# Patient Record
Sex: Male | Born: 1965 | Race: White | Hispanic: No | Marital: Married | Smoking: Current every day smoker
Health system: Southern US, Community
[De-identification: ages and names within clinical notes are randomized; demographics above are authoritative.]

## PROBLEM LIST (undated history)

## (undated) DIAGNOSIS — E079 Disorder of thyroid, unspecified: Secondary | ICD-10-CM

## (undated) DIAGNOSIS — E785 Hyperlipidemia, unspecified: Secondary | ICD-10-CM

## (undated) DIAGNOSIS — I639 Cerebral infarction, unspecified: Secondary | ICD-10-CM

## (undated) DIAGNOSIS — I10 Essential (primary) hypertension: Secondary | ICD-10-CM

## (undated) DIAGNOSIS — G8929 Other chronic pain: Secondary | ICD-10-CM

## (undated) DIAGNOSIS — E039 Hypothyroidism, unspecified: Secondary | ICD-10-CM

## (undated) DIAGNOSIS — F32A Depression, unspecified: Secondary | ICD-10-CM

## (undated) DIAGNOSIS — I4891 Unspecified atrial fibrillation: Secondary | ICD-10-CM

## (undated) HISTORY — PX: SHOULDER SURGERY: SHX246

---

## 2001-02-15 ENCOUNTER — Ambulatory Visit (HOSPITAL_BASED_OUTPATIENT_CLINIC_OR_DEPARTMENT_OTHER): Admission: RE | Admit: 2001-02-15 | Discharge: 2001-02-15 | Payer: Self-pay | Admitting: Orthopedic Surgery

## 2005-03-07 ENCOUNTER — Emergency Department: Payer: Self-pay | Admitting: Emergency Medicine
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
## 2005-09-27 ENCOUNTER — Emergency Department: Payer: Self-pay | Admitting: Emergency Medicine

## 2012-06-08 ENCOUNTER — Emergency Department: Payer: Self-pay | Admitting: *Deleted

## 2012-06-08 LAB — CK TOTAL AND CKMB (NOT AT ARMC)
CK, Total: 56 U/L (ref 35–232)
CK-MB: 0.8 ng/mL (ref 0.5–3.6)

## 2012-06-08 LAB — BASIC METABOLIC PANEL
BUN: 19 mg/dL — ABNORMAL HIGH (ref 7–18)
Chloride: 109 mmol/L — ABNORMAL HIGH (ref 98–107)
Creatinine: 0.78 mg/dL (ref 0.60–1.30)
EGFR (African American): >60
EGFR (Non-African Amer.): >60
Osmolality: 284 (ref 275–301)
Sodium: 141 mmol/L (ref 136–145)

## 2012-06-08 LAB — CBC
HCT: 49.1 % (ref 40.0–52.0)
MCH: 32.3 pg (ref 26.0–34.0)
MCHC: 34.2 g/dL (ref 32.0–36.0)
MCV: 95 fL (ref 80–100)
Platelet: 244 10*3/uL (ref 150–440)
RDW: 13 % (ref 11.5–14.5)

## 2012-06-08 LAB — TROPONIN I
Troponin-I: <0.02 ng/mL
Troponin-I: <0.02 ng/mL

## 2013-12-24 ENCOUNTER — Ambulatory Visit: Payer: Self-pay | Admitting: Physician Assistant

## 2013-12-24 LAB — RAPID STREP-A WITH REFLX: Micro Text Report: NEGATIVE

## 2013-12-27 LAB — BETA STREP CULTURE(ARMC)

## 2019-06-03 ENCOUNTER — Encounter: Payer: Self-pay | Admitting: Emergency Medicine

## 2019-06-03 ENCOUNTER — Other Ambulatory Visit: Payer: Self-pay

## 2019-06-03 ENCOUNTER — Emergency Department: Payer: BC Managed Care – PPO

## 2019-06-03 ENCOUNTER — Emergency Department
Admission: EM | Admit: 2019-06-03 | Discharge: 2019-06-03 | Disposition: A | Discharge status: Home / Self Care | Payer: BC Managed Care – PPO | Attending: Emergency Medicine | Admitting: Emergency Medicine

## 2019-06-03 DIAGNOSIS — Z79899 Other long term (current) drug therapy: Secondary | ICD-10-CM | POA: Insufficient documentation

## 2019-06-03 DIAGNOSIS — F17210 Nicotine dependence, cigarettes, uncomplicated: Secondary | ICD-10-CM | POA: Insufficient documentation

## 2019-06-03 DIAGNOSIS — M5417 Radiculopathy, lumbosacral region: Secondary | ICD-10-CM | POA: Diagnosis not present

## 2019-06-03 DIAGNOSIS — I10 Essential (primary) hypertension: Secondary | ICD-10-CM | POA: Insufficient documentation

## 2019-06-03 DIAGNOSIS — R1030 Lower abdominal pain, unspecified: Secondary | ICD-10-CM

## 2019-06-03 HISTORY — DX: Essential (primary) hypertension: I10

## 2019-06-03 HISTORY — DX: Disorder of thyroid, unspecified: E07.9

## 2019-06-03 HISTORY — DX: Other chronic pain: G89.29

## 2019-06-03 LAB — URINALYSIS, ROUTINE W REFLEX MICROSCOPIC
Bacteria, UA: NONE SEEN
Bilirubin Urine: NEGATIVE
Glucose, UA: NEGATIVE mg/dL
Ketones, ur: NEGATIVE mg/dL
Leukocytes,Ua: NEGATIVE
Nitrite: NEGATIVE
Protein, ur: NEGATIVE mg/dL
Specific Gravity, Urine: >1.046 — ABNORMAL HIGH (ref 1.005–1.030)
Squamous Epithelial / LPF: NONE SEEN (ref 0–5)
pH: 6 (ref 5.0–8.0)

## 2019-06-03 LAB — BASIC METABOLIC PANEL
Anion gap: 11 (ref 5–15)
BUN: 10 mg/dL (ref 6–20)
CO2: 24 mmol/L (ref 22–32)
Calcium: 9.2 mg/dL (ref 8.9–10.3)
Chloride: 105 mmol/L (ref 98–111)
Creatinine, Ser: 0.68 mg/dL (ref 0.61–1.24)
GFR calc Af Amer: >60 mL/min (ref >60)
GFR calc non Af Amer: >60 mL/min (ref >60)
Glucose, Bld: 103 mg/dL — ABNORMAL HIGH (ref 70–99)
Potassium: 3.8 mmol/L (ref 3.5–5.1)
Sodium: 140 mmol/L (ref 135–145)

## 2019-06-03 LAB — CBC
HCT: 42.1 % (ref 39.0–52.0)
Hemoglobin: 14.3 g/dL (ref 13.0–17.0)
MCH: 31.6 pg (ref 26.0–34.0)
MCHC: 34 g/dL (ref 30.0–36.0)
MCV: 92.9 fL (ref 80.0–100.0)
Platelets: 236 10*3/uL (ref 150–400)
RBC: 4.53 MIL/uL (ref 4.22–5.81)
RDW: 13 % (ref 11.5–15.5)
WBC: 11 10*3/uL — ABNORMAL HIGH (ref 4.0–10.5)
nRBC: 0 % (ref 0.0–0.2)

## 2019-06-03 MED ORDER — DEXAMETHASONE SODIUM PHOSPHATE 10 MG/ML IJ SOLN
10.0000 mg | Freq: Once | INTRAMUSCULAR | Status: AC
  Administered 2019-06-03: 19:00:00 10 mg via INTRAVENOUS
  Filled 2019-06-03: qty 1

## 2019-06-03 MED ORDER — OXYCODONE-ACETAMINOPHEN 5-325 MG PO TABS
2.0000 | ORAL_TABLET | Freq: Once | ORAL | Status: AC
  Administered 2019-06-03: 22:00:00 2 via ORAL
  Filled 2019-06-03: qty 2

## 2019-06-03 MED ORDER — HYDROMORPHONE HCL 1 MG/ML IJ SOLN
1.0000 mg | Freq: Once | INTRAMUSCULAR | Status: AC
  Administered 2019-06-03: 1 mg via INTRAVENOUS
  Filled 2019-06-03: qty 1

## 2019-06-03 MED ORDER — KETOROLAC TROMETHAMINE 30 MG/ML IJ SOLN
15.0000 mg | Freq: Once | INTRAMUSCULAR | Status: AC
  Administered 2019-06-03: 17:00:00 15 mg via INTRAVENOUS
  Filled 2019-06-03: qty 1

## 2019-06-03 MED ORDER — IOHEXOL 300 MG/ML  SOLN
100.0000 mL | Freq: Once | INTRAMUSCULAR | Status: AC | PRN
  Administered 2019-06-03: 100 mL via INTRAVENOUS

## 2019-06-03 MED ORDER — SODIUM CHLORIDE 0.9 % IV BOLUS
1000.0000 mL | Freq: Once | INTRAVENOUS | Status: AC
  Administered 2019-06-03: 19:00:00 1000 mL via INTRAVENOUS

## 2019-06-03 MED ORDER — OXYCODONE HCL 5 MG PO TABS: 5.0000 mg | ORAL_TABLET | Freq: Four times a day (QID) | ORAL | 0 refills | Status: AC | PRN

## 2019-06-03 MED ORDER — PREDNISONE 20 MG PO TABS: 40.0000 mg | ORAL_TABLET | Freq: Every day | ORAL | 0 refills | Status: AC

## 2019-06-03 NOTE — ED Notes (Signed)
Peripheral IV discontinued. Catheter intact. No signs of infiltration or redness. Gauze applied to IV site.    Discharge instructions reviewed with patient. Questions fielded by this RN. Patient verbalizes understanding of instructions. Patient discharged home in stable condition per issacs. No acute distress noted at time of discharge.    

## 2019-06-03 NOTE — ED Provider Notes (Signed)
[facility] Emergency Department Provider Note  ____________________________________________   First MD Initiated Contact with Patient 06/03/19 1639     (approximate)  I have reviewed the triage vital signs and the nursing notes.   HISTORY  Chief Complaint Groin Pain    HPI Marc Miller is a 53 y.o. male  With h/o chronic pain, HTN, here with severe groin pain. Pt states that no Thursday PM, he was getting out of his truck when he experienced acute onset of severe groin pain. He's had associated mild sacral pain radiating aroudn to the groin. He is adamant he did not fall or injure himself, but does have chronic back issues. Pain shoots throughout his groin but denies any loss of bowel or bladder continence. Denies any LE weakness or numbness. He has had difficulty ambulating 2/2 his pain. No fevers, chills. No testicular pain or swelling. No hematuria, dysuria. No diarrhea or constipation. Pain is worse w/ standing, no alleviating factors noted.     Past Medical History:  Diagnosis Date   Chronic pain    Hypertension    Thyroid disease     There are no active problems to display for this patient.   Past Surgical History:  Procedure Laterality Date   SHOULDER SURGERY Left     Prior to Admission medications   Medication Sig Start Date End Date Taking? Authorizing Provider  oxyCODONE (ROXICODONE) 5 MG immediate release tablet Take 1-2 tablets (5-10 mg total) by mouth every 6 (six) hours as needed for moderate pain or severe pain. 06/03/19 06/02/20  Shaune PollackIsaacs, Ita Fritzsche, MD  predniSONE (DELTASONE) 20 MG tablet Take 2 tablets (40 mg total) by mouth daily for 5 days. 06/03/19 06/08/19  Shaune PollackIsaacs, Kayhan Boardley, MD    Allergies Bupropion, Aspirin, and Methimazole  History reviewed. No pertinent family history.  Social History Social History   Tobacco Use   Smoking status: Current Every Day Smoker    Packs/day: 0.25    Types: Cigarettes    Smokeless tobacco: Never Used  Substance Use Topics   Alcohol use: Not on file   Drug use: Not on file    Review of Systems  Review of Systems  Constitutional: Negative for chills, fatigue and fever.  HENT: Negative for sore throat.   Respiratory: Negative for shortness of breath.   Cardiovascular: Negative for chest pain.  Gastrointestinal: Positive for abdominal pain.  Genitourinary: Negative for flank pain.  Musculoskeletal: Positive for back pain and gait problem. Negative for neck pain.  Skin: Negative for rash and wound.  Allergic/Immunologic: Negative for immunocompromised state.  Neurological: Negative for weakness and numbness.  Hematological: Does not bruise/bleed easily.  All other systems reviewed and are negative.    ____________________________________________  PHYSICAL EXAM:      VITAL SIGNS: ED Triage Vitals  Enc Vitals Group     BP 06/03/19 1348 110/78     Pulse Rate 06/03/19 1348 74     Resp 06/03/19 1348 18     Temp 06/03/19 1348 99.7 F (37.6 C)     Temp Source 06/03/19 1348 Oral     SpO2 06/03/19 1348 97 %     Weight 06/03/19 1352 200 lb (90.7 kg)     Height 06/03/19 1352 5' 8.5" (1.74 m)     Head Circumference --      Peak Flow --      Pain Score 06/03/19 1351 8     Pain Loc --      Pain  Edu? --      Excl. in GC? --      Physical Exam Vitals signs and nursing note reviewed.  Constitutional:      General: He is not in acute distress.    Appearance: He is well-developed.  HENT:     Head: Normocephalic and atraumatic.  Eyes:     Conjunctiva/sclera: Conjunctivae normal.  Neck:     Musculoskeletal: Neck supple.  Cardiovascular:     Rate and Rhythm: Normal rate and regular rhythm.     Heart sounds: Normal heart sounds. No murmur. No friction rub.  Pulmonary:     Effort: Pulmonary effort is normal. No respiratory distress.     Breath sounds: Normal breath sounds. No wheezing or rales.  Abdominal:     General: There is no distension.      Palpations: Abdomen is soft.     Tenderness: There is no abdominal tenderness.  Genitourinary:    Comments: Testes descended bilaterally. Normal lie. No penile lesions. Normal perineal sensation. Skin:    General: Skin is warm.     Capillary Refill: Capillary refill takes less than 2 seconds.  Neurological:     Mental Status: He is alert and oriented to person, place, and time.     Motor: No abnormal muscle tone.      Spine Exam: Inspection/Palpation: Moderate lower lumbar TTP. No deformity. No stepoffs. Strength: 5/5 throughout LE bilaterally (hip flexion/extension, adduction/abduction; knee flexion/extension; foot dorsiflexion/plantarflexion, inversion/eversion; great toe inversion) Sensation: Intact to light touch in proximal and distal LE bilaterally Reflexes: 2+ quadriceps and achilles reflexes ____________________________________________   LABS (all labs ordered are listed, but only abnormal results are displayed)  Labs Reviewed  CBC - Abnormal; Notable for the following components:      Result Value   WBC 11.0 (*)    All other components within normal limits  BASIC METABOLIC PANEL - Abnormal; Notable for the following components:   Glucose, Bld 103 (*)    All other components within normal limits  URINALYSIS, ROUTINE W REFLEX MICROSCOPIC - Abnormal; Notable for the following components:   Color, Urine YELLOW (*)    APPearance CLEAR (*)    Specific Gravity, Urine >1.046 (*)    Hgb urine dipstick MODERATE (*)    All other components within normal limits    ____________________________________________  EKG: None ________________________________________  RADIOLOGY All imaging, including plain films, CT scans, and ultrasounds, independently reviewed by me, and interpretations confirmed via formal radiology reads.  ED MD interpretation:   CT: L5-S1 advanced disc disease, no other abdominal or pelvic findings, no mass lesions or adenopathy  Official radiology  report(s): Ct Abdomen Pelvis W Contrast  Result Date: 06/03/2019 CLINICAL DATA:  Severe lower abdominal pain since Thursday. EXAM: CT ABDOMEN AND PELVIS WITH CONTRAST TECHNIQUE: Multidetector CT imaging of the abdomen and pelvis was performed using the standard protocol following bolus administration of intravenous contrast. CONTRAST:  100mL OMNIPAQUE IOHEXOL 300 MG/ML  SOLN COMPARISON:  None. FINDINGS: Lower chest: The lung bases are clear of acute process. No pleural effusion or pulmonary lesions. The heart is normal in size. No pericardial effusion. The distal esophagus and aorta are unremarkable. Hepatobiliary: No focal hepatic lesions or intrahepatic biliary dilatation. The gallbladder is normal. No common bile duct dilatation. Pancreas: No mass, inflammation or ductal dilatation. Spleen: Normal size.  No focal lesions. Adrenals/Urinary Tract: The adrenal glands and kidneys are unremarkable. No renal, ureteral or bladder calculi or mass is identified. Cortical scarring changes at the  midpole lower pole junction region of the right kidney may be related to prior infection or infarction. Normal perfusion of both kidneys. The delayed images do not demonstrate any significant collecting system abnormalities. Stomach/Bowel: The stomach, duodenum, small bowel and colon are unremarkable. No acute inflammatory changes, mass lesions or obstructive findings. The terminal ileum and appendix are normal. Vascular/Lymphatic: The aorta is normal in caliber. No dissection. The branch vessels are patent. The major venous structures are patent. No mesenteric or retroperitoneal mass or adenopathy. Small scattered lymph nodes are noted. Reproductive: The prostate gland and seminal vesicles are unremarkable. Other: No pelvic mass or adenopathy. No free pelvic fluid collections. No inguinal mass or adenopathy. No abdominal wall hernia or subcutaneous lesions. Musculoskeletal: No significant bony findings. Moderate degenerative  changes are noted in the lumbar spine with advanced disc disease at L5-S1. No large disc protrusions or obvious spinal stenosis. IMPRESSION: 1. No acute abdominal/pelvic findings, mass lesions or adenopathy. 2. Moderate degenerative changes involving the spine with advanced disc disease at L5-S1 but no obvious large lumbar disc protrusions, spinal or foraminal stenosis. Electronically Signed   By: Marijo Sanes M.D.   On: 06/03/2019 17:40    ____________________________________________  PROCEDURES   Procedure(s) performed (including Critical Care):  Procedures  ____________________________________________  INITIAL IMPRESSION / MDM / [place] / ED COURSE  As part of my medical decision making, I reviewed the following data within the electronic MEDICAL RECORD NUMBER Notes from prior ED visits and [place] Controlled Substance Database      *Lazlo Tunney Miller was evaluated in Emergency Department on 06/03/2019 for the symptoms described in the history of present illness. He was evaluated in the context of the global COVID-19 pandemic, which necessitated consideration that the patient might be at risk for infection with the SARS-CoV-2 virus that causes COVID-19. Institutional protocols and algorithms that pertain to the evaluation of patients at risk for COVID-19 are in a state of rapid change based on information released by regulatory bodies including the CDC and federal and state organizations. These policies and algorithms were followed during the patient's care in the ED.  Some ED evaluations and interventions may be delayed as a result of limited staffing during the pandemic.*      Medical Decision Making: 53 yo M here with severe medial and posterior leg and groin pain after getting out of his truck. No fevers. No dysuria or urinary sx. On exam, he is awake, alert, and in NAD. Distal NVI in b/l LE. UA c/w dehydration, mild hematuria noted but no stone noted on CT. CT scan shows  L5-S1 radiculopathy which could explain his pain and makes sense w/ his positional nature. DDx also includes renal stone, likely passed, 2/2 hematuria though h/o same. No signs of UTI, prostatitis on exam or labs. No evidence of other intra-abd emergency. Testes descended b/l with no signs of torsion or GU abnormality. He's ambulatory after analgesia here and feels markedly improved. Will d/c with brief additional analgesics, steroids, outpt referral.  ____________________________________________  FINAL CLINICAL IMPRESSION(S) / ED DIAGNOSES  Final diagnoses:  Inguinal pain, unspecified laterality  Lumbosacral radiculopathy     MEDICATIONS GIVEN DURING THIS VISIT:  Medications  oxyCODONE-acetaminophen (PERCOCET/ROXICET) 5-325 MG per tablet 2 tablet (has no administration in time range)  HYDROmorphone (DILAUDID) injection 1 mg (1 mg Intravenous Given 06/03/19 1712)  ketorolac (TORADOL) 30 MG/ML injection 15 mg (15 mg Intravenous Given 06/03/19 1711)  iohexol (OMNIPAQUE) 300 MG/ML solution 100 mL (100 mLs  Intravenous Contrast Given 06/03/19 1721)  sodium chloride 0.9 % bolus 1,000 mL (0 mLs Intravenous Stopped 06/03/19 2025)  dexamethasone (DECADRON) injection 10 mg (10 mg Intravenous Given 06/03/19 1840)  HYDROmorphone (DILAUDID) injection 1 mg (1 mg Intravenous Given 06/03/19 1842)     ED Discharge Orders         Ordered    oxyCODONE (ROXICODONE) 5 MG immediate release tablet  Every 6 hours PRN     06/03/19 2144    predniSONE (DELTASONE) 20 MG tablet  Daily     06/03/19 2144           Note:  This document was prepared using Dragon voice recognition software and may include unintentional dictation errors.   Shaune PollackIsaacs, Jadea Shiffer, MD 06/03/19 2206

## 2019-06-03 NOTE — ED Triage Notes (Addendum)
Pt arrived via EMS with reports of bilateral groin pain that started Thursday around 6:15pm.  Pt states the pain is worse and states he is unable to put any weight on his legs. Pt has he does not feel any tenderness around scrotum or noticed any swelling.  Pt denies any dysuria. Pt denies any injury.

## 2019-06-03 NOTE — ED Notes (Signed)
Pt c/o groin pain, no swelling or redness noted, no discharge per pt.

## 2019-06-03 NOTE — ED Notes (Signed)
Discussed with Dr. Archie Balboa, new orders received for lab work and UA.

## 2019-06-03 NOTE — ED Notes (Signed)
Patient transported to CT 

## 2021-05-30 ENCOUNTER — Encounter: Payer: Self-pay | Admitting: Internal Medicine

## 2021-05-30 ENCOUNTER — Emergency Department: Payer: Medicaid Other

## 2021-05-30 ENCOUNTER — Encounter: Payer: Self-pay | Admitting: Emergency Medicine

## 2021-05-30 ENCOUNTER — Inpatient Hospital Stay
Admission: EM | Admit: 2021-05-30 | Discharge: 2021-06-02 | DRG: 871 | Disposition: A | Discharge status: Home / Self Care | Payer: Medicaid Other | Attending: Internal Medicine | Admitting: Internal Medicine

## 2021-05-30 DIAGNOSIS — Z20822 Contact with and (suspected) exposure to covid-19: Secondary | ICD-10-CM | POA: Diagnosis present

## 2021-05-30 DIAGNOSIS — Z8673 Personal history of transient ischemic attack (TIA), and cerebral infarction without residual deficits: Secondary | ICD-10-CM | POA: Diagnosis not present

## 2021-05-30 DIAGNOSIS — J180 Bronchopneumonia, unspecified organism: Secondary | ICD-10-CM | POA: Diagnosis not present

## 2021-05-30 DIAGNOSIS — A040 Enteropathogenic Escherichia coli infection: Secondary | ICD-10-CM | POA: Diagnosis present

## 2021-05-30 DIAGNOSIS — E860 Dehydration: Secondary | ICD-10-CM | POA: Diagnosis present

## 2021-05-30 DIAGNOSIS — Z886 Allergy status to analgesic agent status: Secondary | ICD-10-CM

## 2021-05-30 DIAGNOSIS — G8929 Other chronic pain: Secondary | ICD-10-CM | POA: Diagnosis present

## 2021-05-30 DIAGNOSIS — F17210 Nicotine dependence, cigarettes, uncomplicated: Secondary | ICD-10-CM | POA: Diagnosis present

## 2021-05-30 DIAGNOSIS — E785 Hyperlipidemia, unspecified: Secondary | ICD-10-CM | POA: Diagnosis present

## 2021-05-30 DIAGNOSIS — Z8349 Family history of other endocrine, nutritional and metabolic diseases: Secondary | ICD-10-CM | POA: Diagnosis not present

## 2021-05-30 DIAGNOSIS — F32A Depression, unspecified: Secondary | ICD-10-CM | POA: Diagnosis present

## 2021-05-30 DIAGNOSIS — R112 Nausea with vomiting, unspecified: Secondary | ICD-10-CM

## 2021-05-30 DIAGNOSIS — Z720 Tobacco use: Secondary | ICD-10-CM | POA: Diagnosis present

## 2021-05-30 DIAGNOSIS — J9601 Acute respiratory failure with hypoxia: Secondary | ICD-10-CM | POA: Diagnosis present

## 2021-05-30 DIAGNOSIS — R7989 Other specified abnormal findings of blood chemistry: Secondary | ICD-10-CM | POA: Diagnosis present

## 2021-05-30 DIAGNOSIS — R1010 Upper abdominal pain, unspecified: Secondary | ICD-10-CM

## 2021-05-30 DIAGNOSIS — Z888 Allergy status to other drugs, medicaments and biological substances status: Secondary | ICD-10-CM

## 2021-05-30 DIAGNOSIS — R1011 Right upper quadrant pain: Secondary | ICD-10-CM | POA: Diagnosis present

## 2021-05-30 DIAGNOSIS — A419 Sepsis, unspecified organism: Secondary | ICD-10-CM | POA: Diagnosis not present

## 2021-05-30 DIAGNOSIS — Z8249 Family history of ischemic heart disease and other diseases of the circulatory system: Secondary | ICD-10-CM | POA: Diagnosis not present

## 2021-05-30 DIAGNOSIS — I4891 Unspecified atrial fibrillation: Secondary | ICD-10-CM | POA: Diagnosis present

## 2021-05-30 DIAGNOSIS — R6520 Severe sepsis without septic shock: Secondary | ICD-10-CM | POA: Diagnosis present

## 2021-05-30 DIAGNOSIS — R778 Other specified abnormalities of plasma proteins: Secondary | ICD-10-CM | POA: Diagnosis present

## 2021-05-30 DIAGNOSIS — I471 Supraventricular tachycardia: Secondary | ICD-10-CM | POA: Diagnosis present

## 2021-05-30 DIAGNOSIS — I10 Essential (primary) hypertension: Secondary | ICD-10-CM | POA: Diagnosis present

## 2021-05-30 DIAGNOSIS — R079 Chest pain, unspecified: Secondary | ICD-10-CM | POA: Diagnosis present

## 2021-05-30 DIAGNOSIS — I639 Cerebral infarction, unspecified: Secondary | ICD-10-CM | POA: Diagnosis present

## 2021-05-30 DIAGNOSIS — E039 Hypothyroidism, unspecified: Secondary | ICD-10-CM | POA: Diagnosis present

## 2021-05-30 DIAGNOSIS — I248 Other forms of acute ischemic heart disease: Secondary | ICD-10-CM | POA: Diagnosis present

## 2021-05-30 DIAGNOSIS — J189 Pneumonia, unspecified organism: Secondary | ICD-10-CM | POA: Insufficient documentation

## 2021-05-30 HISTORY — DX: Depression, unspecified: F32.A

## 2021-05-30 HISTORY — DX: Hyperlipidemia, unspecified: E78.5

## 2021-05-30 HISTORY — DX: Cerebral infarction, unspecified: I63.9

## 2021-05-30 HISTORY — DX: Hypothyroidism, unspecified: E03.9

## 2021-05-30 HISTORY — DX: Unspecified atrial fibrillation: I48.91

## 2021-05-30 LAB — LACTIC ACID, PLASMA
Lactic Acid, Venous: 2.2 mmol/L (ref 0.5–1.9)
Lactic Acid, Venous: 1 mmol/L (ref 0.5–1.9)

## 2021-05-30 LAB — CBC WITH DIFFERENTIAL/PLATELET
Abs Immature Granulocytes: 0.03 10*3/uL (ref 0.00–0.07)
Basophils Absolute: 0.1 10*3/uL (ref 0.0–0.1)
Basophils Relative: 1 %
Eosinophils Absolute: 0.2 10*3/uL (ref 0.0–0.5)
Eosinophils Relative: 1 %
HCT: 57.4 % — ABNORMAL HIGH (ref 39.0–52.0)
Hemoglobin: 20.4 g/dL — ABNORMAL HIGH (ref 13.0–17.0)
Immature Granulocytes: 0 %
Lymphocytes Relative: 32 %
Lymphs Abs: 4.1 10*3/uL — ABNORMAL HIGH (ref 0.7–4.0)
MCH: 32.2 pg (ref 26.0–34.0)
MCHC: 35.5 g/dL (ref 30.0–36.0)
MCV: 90.7 fL (ref 80.0–100.0)
Monocytes Absolute: 1.1 10*3/uL — ABNORMAL HIGH (ref 0.1–1.0)
Monocytes Relative: 9 %
Neutro Abs: 7.4 10*3/uL (ref 1.7–7.7)
Neutrophils Relative %: 57 %
Platelets: 299 10*3/uL (ref 150–400)
RBC: 6.33 MIL/uL — ABNORMAL HIGH (ref 4.22–5.81)
RDW: 12.8 % (ref 11.5–15.5)
WBC: 13 10*3/uL — ABNORMAL HIGH (ref 4.0–10.5)
nRBC: 0 % (ref 0.0–0.2)

## 2021-05-30 LAB — RESP PANEL BY RT-PCR (FLU A&B, COVID) ARPGX2
Influenza A by PCR: NEGATIVE
Influenza B by PCR: NEGATIVE
SARS Coronavirus 2 by RT PCR: NEGATIVE

## 2021-05-30 LAB — COMPREHENSIVE METABOLIC PANEL
ALT: 17 U/L (ref 0–44)
AST: 26 U/L (ref 15–41)
Albumin: 4.7 g/dL (ref 3.5–5.0)
Alkaline Phosphatase: 107 U/L (ref 38–126)
Anion gap: 10 (ref 5–15)
BUN: 19 mg/dL (ref 6–20)
CO2: 27 mmol/L (ref 22–32)
Calcium: 10.1 mg/dL (ref 8.9–10.3)
Chloride: 104 mmol/L (ref 98–111)
Creatinine, Ser: 1.24 mg/dL (ref 0.61–1.24)
GFR, Estimated: >60 mL/min (ref >60)
Glucose, Bld: 130 mg/dL — ABNORMAL HIGH (ref 70–99)
Potassium: 3.5 mmol/L (ref 3.5–5.1)
Sodium: 141 mmol/L (ref 135–145)
Total Bilirubin: 1 mg/dL (ref 0.3–1.2)
Total Protein: 8.5 g/dL — ABNORMAL HIGH (ref 6.5–8.1)

## 2021-05-30 LAB — URINE DRUG SCREEN, QUALITATIVE (ARMC ONLY)
Amphetamines, Ur Screen: NOT DETECTED
Barbiturates, Ur Screen: NOT DETECTED
Benzodiazepine, Ur Scrn: NOT DETECTED
Cannabinoid 50 Ng, Ur ~~LOC~~: POSITIVE — AB
Cocaine Metabolite,Ur ~~LOC~~: NOT DETECTED
MDMA (Ecstasy)Ur Screen: NOT DETECTED
Methadone Scn, Ur: NOT DETECTED
Opiate, Ur Screen: POSITIVE — AB
Phencyclidine (PCP) Ur S: NOT DETECTED
Tricyclic, Ur Screen: POSITIVE — AB

## 2021-05-30 LAB — TROPONIN I (HIGH SENSITIVITY)
Troponin I (High Sensitivity): 29 ng/L — ABNORMAL HIGH (ref <18)
Troponin I (High Sensitivity): 36 ng/L — ABNORMAL HIGH (ref <18)
Troponin I (High Sensitivity): 39 ng/L — ABNORMAL HIGH (ref <18)

## 2021-05-30 LAB — BRAIN NATRIURETIC PEPTIDE: B Natriuretic Peptide: 71.9 pg/mL (ref 0.0–100.0)

## 2021-05-30 LAB — HIV ANTIBODY (ROUTINE TESTING W REFLEX): HIV Screen 4th Generation wRfx: NONREACTIVE

## 2021-05-30 LAB — PROCALCITONIN: Procalcitonin: <0.1 ng/mL

## 2021-05-30 LAB — LIPASE, BLOOD: Lipase: 25 U/L (ref 11–51)

## 2021-05-30 LAB — D-DIMER, QUANTITATIVE: D-Dimer, Quant: 0.36 ug/mL-FEU (ref 0.00–0.50)

## 2021-05-30 LAB — MAGNESIUM: Magnesium: 1.8 mg/dL (ref 1.7–2.4)

## 2021-05-30 MED ORDER — ACETAMINOPHEN 325 MG PO TABS
650.0000 mg | ORAL_TABLET | Freq: Four times a day (QID) | ORAL | Status: DC | PRN
  Filled 2021-05-30: qty 2

## 2021-05-30 MED ORDER — LEVOTHYROXINE SODIUM 50 MCG PO TABS
75.0000 ug | ORAL_TABLET | Freq: Every day | ORAL | Status: DC
  Administered 2021-05-31 – 2021-06-02 (×3): 75 ug via ORAL
  Filled 2021-05-30 (×3): qty 1

## 2021-05-30 MED ORDER — DM-GUAIFENESIN ER 30-600 MG PO TB12
1.0000 | ORAL_TABLET | Freq: Two times a day (BID) | ORAL | Status: DC | PRN
  Administered 2021-05-31: 1 via ORAL
  Filled 2021-05-30: qty 1

## 2021-05-30 MED ORDER — MORPHINE SULFATE (PF) 4 MG/ML IV SOLN
4.0000 mg | Freq: Once | INTRAVENOUS | Status: AC
Start: 2021-05-30 — End: 2021-05-30
  Administered 2021-05-30: 4 mg via INTRAVENOUS
  Filled 2021-05-30: qty 1

## 2021-05-30 MED ORDER — NICOTINE 21 MG/24HR TD PT24
21.0000 mg | MEDICATED_PATCH | Freq: Every day | TRANSDERMAL | Status: DC
  Filled 2021-05-30 (×2): qty 1

## 2021-05-30 MED ORDER — SODIUM CHLORIDE 0.9 % IV SOLN
1.0000 g | INTRAVENOUS | Status: DC
  Administered 2021-05-31: 1 g via INTRAVENOUS
  Filled 2021-05-30: qty 1

## 2021-05-30 MED ORDER — POTASSIUM CHLORIDE CRYS ER 20 MEQ PO TBCR
40.0000 meq | EXTENDED_RELEASE_TABLET | Freq: Once | ORAL | Status: AC
  Administered 2021-05-30: 40 meq via ORAL
  Filled 2021-05-30: qty 2

## 2021-05-30 MED ORDER — ROSUVASTATIN CALCIUM 10 MG PO TABS
10.0000 mg | ORAL_TABLET | Freq: Every day | ORAL | Status: DC
  Administered 2021-05-31 – 2021-06-02 (×3): 10 mg via ORAL
  Filled 2021-05-30 (×3): qty 1

## 2021-05-30 MED ORDER — DILTIAZEM HCL 25 MG/5ML IV SOLN
5.0000 mg | Freq: Once | INTRAVENOUS | Status: AC
  Administered 2021-05-30: 5 mg via INTRAVENOUS
  Filled 2021-05-30: qty 5

## 2021-05-30 MED ORDER — DILTIAZEM HCL-DEXTROSE 125-5 MG/125ML-% IV SOLN (PREMIX)
5.0000 mg/h | INTRAVENOUS | Status: DC
Start: 2021-05-30 — End: 2021-05-31
  Administered 2021-05-30: 5 mg/h via INTRAVENOUS
  Administered 2021-05-31: 10 mg/h via INTRAVENOUS
  Filled 2021-05-30 (×2): qty 125

## 2021-05-30 MED ORDER — DULOXETINE HCL 30 MG PO CPEP
60.0000 mg | ORAL_CAPSULE | Freq: Every day | ORAL | Status: DC
  Administered 2021-05-31 – 2021-06-02 (×3): 60 mg via ORAL
  Filled 2021-05-30 (×3): qty 2

## 2021-05-30 MED ORDER — DILTIAZEM HCL 25 MG/5ML IV SOLN
5.0000 mg | Freq: Once | INTRAVENOUS | Status: AC
  Administered 2021-05-30: 5 mg via INTRAVENOUS

## 2021-05-30 MED ORDER — LACTATED RINGERS IV BOLUS
1000.0000 mL | Freq: Once | INTRAVENOUS | Status: AC
  Administered 2021-05-30: 1000 mL via INTRAVENOUS

## 2021-05-30 MED ORDER — MORPHINE SULFATE (PF) 2 MG/ML IV SOLN
2.0000 mg | INTRAVENOUS | Status: DC | PRN
Start: 2021-05-30 — End: 2021-05-31
  Administered 2021-05-30 (×2): 2 mg via INTRAVENOUS
  Filled 2021-05-30 (×2): qty 1

## 2021-05-30 MED ORDER — ONDANSETRON HCL 4 MG/2ML IJ SOLN
4.0000 mg | INTRAMUSCULAR | Status: AC
  Administered 2021-05-30: 4 mg via INTRAVENOUS
  Filled 2021-05-30: qty 2

## 2021-05-30 MED ORDER — PREGABALIN 75 MG PO CAPS
75.0000 mg | ORAL_CAPSULE | Freq: Three times a day (TID) | ORAL | Status: DC
  Administered 2021-05-30 – 2021-06-02 (×8): 75 mg via ORAL
  Filled 2021-05-30 (×2): qty 1
  Filled 2021-05-30: qty 3
  Filled 2021-05-30 (×5): qty 1

## 2021-05-30 MED ORDER — SODIUM CHLORIDE 0.9 % IV SOLN
500.0000 mg | Freq: Once | INTRAVENOUS | Status: AC
  Administered 2021-05-30: 500 mg via INTRAVENOUS
  Filled 2021-05-30: qty 500

## 2021-05-30 MED ORDER — ALBUTEROL SULFATE HFA 108 (90 BASE) MCG/ACT IN AERS
2.0000 | INHALATION_SPRAY | RESPIRATORY_TRACT | Status: DC | PRN
  Administered 2021-06-01: 2 via RESPIRATORY_TRACT
  Filled 2021-05-30 (×2): qty 6.7

## 2021-05-30 MED ORDER — HYDRALAZINE HCL 20 MG/ML IJ SOLN: 5.0000 mg | INTRAMUSCULAR | Status: DC | PRN

## 2021-05-30 MED ORDER — METOPROLOL TARTRATE 50 MG PO TABS
100.0000 mg | ORAL_TABLET | Freq: Two times a day (BID) | ORAL | Status: DC
  Administered 2021-05-30 – 2021-06-02 (×6): 100 mg via ORAL
  Filled 2021-05-30 (×6): qty 2

## 2021-05-30 MED ORDER — CYCLOBENZAPRINE HCL 10 MG PO TABS
10.0000 mg | ORAL_TABLET | Freq: Three times a day (TID) | ORAL | Status: DC | PRN
  Administered 2021-05-31 – 2021-06-02 (×3): 10 mg via ORAL
  Filled 2021-05-30 (×3): qty 1

## 2021-05-30 MED ORDER — SODIUM CHLORIDE 0.9 % IV SOLN
1.0000 g | Freq: Once | INTRAVENOUS | Status: AC
  Administered 2021-05-30: 1 g via INTRAVENOUS
  Filled 2021-05-30: qty 10

## 2021-05-30 MED ORDER — LACTATED RINGERS IV BOLUS
500.0000 mL | Freq: Once | INTRAVENOUS | Status: AC
  Administered 2021-05-30: 500 mL via INTRAVENOUS

## 2021-05-30 MED ORDER — SODIUM CHLORIDE 0.9 % IV SOLN
500.0000 mg | INTRAVENOUS | Status: DC
  Administered 2021-05-31: 500 mg via INTRAVENOUS
  Filled 2021-05-30: qty 500

## 2021-05-30 MED ORDER — APIXABAN 5 MG PO TABS
5.0000 mg | ORAL_TABLET | Freq: Two times a day (BID) | ORAL | Status: DC
  Administered 2021-05-30 – 2021-06-02 (×7): 5 mg via ORAL
  Filled 2021-05-30 (×7): qty 1

## 2021-05-30 MED ORDER — MAGNESIUM SULFATE IN D5W 1-5 GM/100ML-% IV SOLN
1.0000 g | Freq: Once | INTRAVENOUS | Status: AC
  Administered 2021-05-30: 1 g via INTRAVENOUS
  Filled 2021-05-30: qty 100

## 2021-05-30 MED ORDER — IPRATROPIUM-ALBUTEROL 0.5-2.5 (3) MG/3ML IN SOLN
3.0000 mL | Freq: Once | RESPIRATORY_TRACT | Status: AC
  Administered 2021-05-30: 3 mL via RESPIRATORY_TRACT
  Filled 2021-05-30: qty 3

## 2021-05-30 MED ORDER — IOHEXOL 350 MG/ML SOLN
100.0000 mL | Freq: Once | INTRAVENOUS | Status: AC | PRN
  Administered 2021-05-30: 100 mL via INTRAVENOUS

## 2021-05-30 MED ORDER — SODIUM CHLORIDE 0.9 % IV SOLN: INTRAVENOUS | Status: DC

## 2021-05-30 MED ORDER — ONDANSETRON HCL 4 MG/2ML IJ SOLN
4.0000 mg | Freq: Three times a day (TID) | INTRAMUSCULAR | Status: DC | PRN
  Administered 2021-05-30 – 2021-06-01 (×3): 4 mg via INTRAVENOUS
  Filled 2021-05-30 (×3): qty 2

## 2021-05-30 NOTE — ED Notes (Signed)
Portable Xray at bedside.

## 2021-05-30 NOTE — ED Provider Notes (Signed)
Patient's ultrasound was negative.  His white count is elevated so I did a CT of the abdomen to make sure there was no infectious process in his abdomen.  This revealed a left lower lobe infiltrate.  The infiltrate would explain the pain and the elevated white count and the hypoxia.  Additionally pneumonia would make his heart rate be somewhat faster.  I have given him fluids his blood pressure is good we gave him 5 mg of diltiazem IV which slowed his heart rate down briefly he had an episode of SVT which terminated itself.  He got another 5 mg of diltiazem IV and his heart rate decreased down into the 1 teens from 150s.  I have ordered the patient IV antibiotics and he is on oxygen for his hypoxia.  We will get him in the hospital.  Social work will likely have to work with him to get him Medicaid so he can afford his Eliquis.   Arnaldo Natal, MD 05/30/21 1037

## 2021-05-30 NOTE — ED Notes (Signed)
Pt transported to CT via stretcher at this time.  

## 2021-05-30 NOTE — ED Notes (Signed)
Pt noted to have increased WOB, endorses increased SOB. Pt states, "when I got back from CT I had a coughing fit and haven't been able to catch my breath since." IVF bolus stopped, MD notified.

## 2021-05-30 NOTE — ED Notes (Signed)
US tech at bedside

## 2021-05-30 NOTE — ED Provider Notes (Signed)
[facility] Emergency Department Provider Note  ____________________________________________   Event Date/Time   First MD Initiated Contact with Patient 05/30/21 [phone]     (approximate)  I have reviewed the triage vital signs and the nursing notes.   HISTORY  Chief Complaint Abdominal Pain and Emesis  Level 5 caveat:  history/ROS limited by acute/critical illness  HPI Marc Miller is a 55 y.o. male who reports a history as listed below which notably includes atrial fibrillation and a prior CVA, but he states he has not been on anticoagulation (Eliquis) for a long time because he cannot afford the medication.  He presents tonight because he has had gradually worsening and now constant upper and right upper quadrant abdominal pain associated with persistent nausea and vomiting.  He says he has not been able to eat or drink much of anything for the last week and it is only gotten worse.  By tonight he is having sharp central chest pain and his wife made him come to the emergency department.  Upon arrival in triage an EKG was performed which demonstrates atrial fibrillation with RVR with a rate as high as 180 and the patient was brought directly to the exam room where I saw him.  He says he is uncomfortable currently but the pain is mild unless I push on his abdomen.  He denies fever, sore throat, lower abdominal pain, and dysuria.  He said he feels dehydrated.  His symptoms are severe, gradual in onset, nothing in particular made it better or worse.  He has no history of cholecystectomy.     Past Medical History:  Diagnosis Date   Atrial fibrillation [facility])    reported by patient   Chronic pain    CVA (cerebral vascular accident) [facility])    reported by patient   Hypertension    Thyroid disease     There are no problems to display for this patient.   Past Surgical History:  Procedure Laterality Date   SHOULDER SURGERY Left     Prior to  Admission medications   Not on File    Allergies Bupropion, Aspirin, and Methimazole  History reviewed. No pertinent family history.  Social History Social History   Tobacco Use   Smoking status: Every Day    Packs/day: 0.25    Types: Cigarettes   Smokeless tobacco: Never  Vaping Use   Vaping Use: Never used  Substance Use Topics   Drug use: Never    Review of Systems Level 5 caveat:  history/ROS limited by acute/critical illness   constitutional: No fever/chills Eyes: No visual changes. ENT: No sore throat. Cardiovascular: Positive for chest pain. Respiratory: Positive for mild shortness of breath. Gastrointestinal: Gradually worsening sharp and aching upper abdominal pain with copious nausea and vomiting over the last week and an inability to tolerate any oral intake.   Genitourinary: Negative for dysuria. Musculoskeletal: Negative for neck pain.  Negative for back pain. Integumentary: Negative for rash. Neurological: Negative for headaches, focal weakness or numbness.   ____________________________________________   PHYSICAL EXAM:  VITAL SIGNS: ED Triage Vitals  Enc Vitals Group     BP 05/30/21 0611 (!) 114/99     Pulse Rate 05/30/21 0611 86     Resp 05/30/21 0611 18     Temp 05/30/21 0611 97.7 F (36.5 C)     Temp Source 05/30/21 0611 Oral     SpO2 05/30/21 0611 97 %     Weight 05/30/21 0612 93  kg (205 lb)     Height --      Head Circumference --      Peak Flow --      Pain Score 05/30/21 0640 8     Pain Loc --      Pain Edu? --      Excl. in GC? --     Constitutional: Alert and oriented.  Eyes: Conjunctivae are normal.  Head: Atraumatic. Nose: No congestion/rhinnorhea. Mouth/Throat: Patient is wearing a mask. Neck: No stridor.  No meningeal signs.   Cardiovascular: Rapid rate between about 120 and 180, irregular rhythm. Good peripheral circulation. Respiratory: Increased respiratory rate but lung sounds are clear.  Increased respiratory  effort with some accessory muscle usage.   Gastrointestinal: Soft and nondistended.  He has mild tenderness to palpation but moderate to severe tenderness to palpation of the right upper quadrant with positive Murphy sign.  He has no lower abdominal tenderness. Musculoskeletal: No lower extremity tenderness nor edema. No gross deformities of extremities. Neurologic:  Normal speech and language. No gross focal neurologic deficits are appreciated.  Skin:  Skin is warm, dry and intact. Psychiatric: Mood and affect are normal. Speech and behavior are normal.  ____________________________________________   LABS (all labs ordered are listed, but only abnormal results are displayed)  Labs Reviewed  CBC WITH DIFFERENTIAL/PLATELET - Abnormal; Notable for the following components:      Result Value   WBC 13.0 (*)    RBC 6.33 (*)    Hemoglobin 20.4 (*)    HCT 57.4 (*)    Lymphs Abs 4.1 (*)    Monocytes Absolute 1.1 (*)    All other components within normal limits  COMPREHENSIVE METABOLIC PANEL - Abnormal; Notable for the following components:   Glucose, Bld 130 (*)    Total Protein 8.5 (*)    All other components within normal limits  TROPONIN I (HIGH SENSITIVITY) - Abnormal; Notable for the following components:   Troponin I (High Sensitivity) 29 (*)    All other components within normal limits  RESP PANEL BY RT-PCR (FLU A&B, COVID) ARPGX2  MAGNESIUM  LIPASE, BLOOD  TROPONIN I (HIGH SENSITIVITY)   ____________________________________________  EKG  ED ECG REPORT I, Loleta Rose, the attending physician, personally viewed and interpreted this ECG.  Date: 05/30/2021 EKG Time: 6:14 AM Rate: 150 Rhythm: Atrial fibrillation with RVR QRS Axis: normal Intervals: Abnormal due to A. fib, otherwise unremarkable ST/T Wave abnormalities: Diffuse ST segment elevation anterior leads and mild depression in lateral leads consistent with rate related ischemia, does not meet STEMI  criteria Narrative Interpretation: Suspect demand ischemia in the setting of uncontrolled atrial fibrillation with RVR, does not meet STEMI criteria. ____________________________________________  RADIOLOGY Ultrasound of the right upper quadrant pending at the time of signout.  ____________________________________________   PROCEDURES   Procedure(s) performed (including Critical Care):  .1-3 Lead EKG Interpretation  Date/Time: 05/30/2021 8:09 AM Performed by: Loleta Rose, MD Authorized by: Loleta Rose, MD     Interpretation: abnormal     ECG rate:  140   ECG rate assessment: tachycardic     Rhythm: atrial fibrillation     Ectopy: none     Conduction: normal   .Critical Care  Date/Time: 05/30/2021 8:10 AM Performed by: Loleta Rose, MD Authorized by: Loleta Rose, MD   Critical care provider statement:    Critical care time was exclusive of:  Separately billable procedures and treating other patients   Critical care was time spent personally by me on  the following activities:  Development of treatment plan with patient or surrogate, discussions with consultants, evaluation of patient's response to treatment, examination of patient, obtaining history from patient or surrogate, ordering and performing treatments and interventions, ordering and review of laboratory studies, ordering and review of radiographic studies, pulse oximetry, re-evaluation of patient's condition and review of old charts   ____________________________________________   INITIAL IMPRESSION / MDM / ASSESSMENT AND PLAN / ED COURSE  As part of my medical decision making, I reviewed the following data within the electronic MEDICAL RECORD NUMBER Nursing notes reviewed and incorporated, Labs reviewed , EKG interpreted , Old chart reviewed, Patient signed out to Dr. Darnelle Catalan, and Notes from prior ED visits   Differential diagnosis includes, but is not limited to, biliary colic, cholecystitis, mesenteric ischemia  due to clot, A. fib with RVR, ACS, PE.  The patient is on the cardiac monitor to evaluate for evidence of arrhythmia and/or significant heart rate changes.  Based on the patient's description of how the pain started in his epigastrium and right upper quadrant, the tenderness to palpation with positive Murphy sign, the nausea and vomiting worse after eating, and decreased level of oral intake, I believe the patient is most likely suffering from biliary colic or cholecystitis which is caused him to become dehydrated and thus to go from chronic rate controlled atrial fibrillation into A. fib with RVR.  Given that he has been in atrial fibrillation for an extended period of time, I do not want to purposefully or accidentally converted him to normal sinus rhythm given the risk of thrombosis propagation.  I will begin with fluid resuscitation with 1 L of lactated Ringer's and pain control with morphine 4 mg IV and antiemetic with Zofran 4 mg IV.  We will evaluate broadly with lab work and obtain a right upper quadrant ultrasound to look for evidence of biliary colic.  I explained to the patient the plan and he agrees.  I also explained that it is near the end of his shift and a different physician will be taking over care for me and he also understands that.     Clinical Course as of 05/30/21 0804  Fri May 30, 2021  3151 Transferring ED care to Dr. Darnelle Catalan to reassess after treatment, check labs, follow up ultrasound.  Anticipate admission. [CF]    Clinical Course User Index [CF] Loleta Rose, MD     ____________________________________________  FINAL CLINICAL IMPRESSION(S) / ED DIAGNOSES  Final diagnoses:  None     MEDICATIONS GIVEN DURING THIS VISIT:  Medications  lactated ringers bolus 1,000 mL (0 mLs Intravenous Stopped 05/30/21 0749)  morphine 4 MG/ML injection 4 mg (4 mg Intravenous Given 05/30/21 0640)  ondansetron (ZOFRAN) injection 4 mg (4 mg Intravenous Given 05/30/21 7616)     ED  Discharge Orders     None        Note:  This document was prepared using Dragon voice recognition software and may include unintentional dictation errors.   Loleta Rose, MD 05/30/21 [phone]

## 2021-05-30 NOTE — Progress Notes (Signed)
CODE SEPSIS - PHARMACY COMMUNICATION  **Broad Spectrum Antibiotics should be administered within 1 hour of Sepsis diagnosis**  Time Code Sepsis Called/Page Received: 11:47  Antibiotics Ordered: Ceftriaxone and Azithromycin  Time of 1st antibiotic administration: Ceftriaxone given at 11:07  Additional action taken by pharmacy: n/a  If necessary, Name of Provider/Nurse Contacted: n/a    Foye Deer ,PharmD Clinical Pharmacist  05/30/2021  1:32 PM

## 2021-05-30 NOTE — ED Notes (Signed)
MD called to administer cardizem.

## 2021-05-30 NOTE — H&P (Addendum)
History and Physical    Marc Miller WPY:[id number] DOB: 04-14-1966 DOA: 05/30/2021  Referring MD/NP/PA:   PCP: [facility]   Patient coming from:  The patient is coming from home.  At baseline, pt is independent for most of ADL.        Chief Complaint: Cough, shortness breath, chest pain, nausea, vomiting, diarrhea, abdominal pain  HPI: Marc Miller is a 55 y.o. male with medical history significant of hypertension, stroke, hypothyroidism, depression, atrial fibrillation stopped taking Eliquis 1 year ago, tobacco abuse, who presents with cough, shortness breath, chest pain, nausea, vomiting, diarrhea, abdominal pain.  Patient states that he has been sick for more than 1 week.  He has abdominal pain, nausea, vomiting and diarrhea.  His abdominal pain is located in the right upper quadrant, constant, moderate, sharp, nonradiating.  Patient has at least 2 times of nonbilious nonbloody vomiting each day, and 2 or 3 times of watery diarrhea each day.  Denies fever or chills. She also has cough with clear mucus production, mild shortness breath and central mild chest pain.  Denies symptoms of UTI.  Patient states that he started taking Eliquis 1 year ago since he could not afford for this medication.  Denies rectal bleeding or dark stool.  No recent fall or head injury.  Patient was found to have atrial fibrillation with RVR, heart rate up to 180s, Cardizem drip started in ED.  ED Course: pt was found to have negative COVID PCR, negative D-dimer 0.36, troponin level 29, 36, lactic acid 2.2, BNP 71.9, GFR>60, lipase 25, liver function normal. Temperature normal, blood pressure 93/77, RR 34, oxygen saturation 88% on room air, which improved with 97% on 2 L oxygen.  Chest x-ray negative. US-RUQ negative.  Patient is admitted to progressive bed as inpatient.  CT-abd/pelvis: Mild right posterior basilar subsegmental atelectasis or infiltrate is  noted.  2.  No other abnormality seen in the abdomen or pelvis.   CT-chest: 1. Right lower lobe Bronchopneumonia; segmental peribronchial nodular opacity. No consolidation or pleural effusion. Reactive appearing mediastinal lymph nodes. 2. Underlying  Emphysema (ICD10-J43.9). 3. Calcified coronary artery atherosclerosis.   Review of Systems:   General: no fevers, chills, no body weight gain, has poor appetite, has fatigue HEENT: no blurry vision, hearing changes or sore throat Respiratory: has dyspnea, coughing, no wheezing CV: has chest pain, no palpitations GI: has nausea, vomiting, abdominal pain, diarrhea, no constipation GU: no dysuria, burning on urination, increased urinary frequency, hematuria  Ext: no leg edema Neuro: no unilateral weakness, numbness, or tingling, no vision change or hearing loss Skin: no rash, no skin tear. MSK: No muscle spasm, no deformity, no limitation of range of movement in spin Heme: No easy bruising.  Travel history: No recent long distant travel.  Allergy:  Allergies  Allergen Reactions   Bupropion Other (See Comments)    irritability   Aspirin Other (See Comments)    Other reaction(s): gi upset Other reaction(s): gi upset    Methimazole Rash    Past Medical History:  Diagnosis Date   Atrial fibrillation (HCC)    reported by patient   Chronic pain    CVA (cerebral vascular accident) (HCC)    reported by patient   Depression    HLD (hyperlipidemia)    Hypertension    Hypothyroidism    Thyroid disease     Past Surgical History:  Procedure Laterality Date   SHOULDER SURGERY Left  Social History:  reports that he has been smoking cigarettes. He has been smoking an average of .25 packs per day. He has never used smokeless tobacco. He reports previous alcohol use. He reports that he does not use drugs.  Family History:  Family History  Problem Relation Age of Onset   Hypertension Mother    Hypertension Sister    Thyroid  disease Sister         Prior to Admission medications   Not on File    Physical Exam: Vitals:   06/01/21 2032 06/02/21 0405 06/02/21 0750 06/02/21 1144  BP: 126/82 111/76 111/69 135/86  Pulse: 67 60 (!) 59 68  Resp: 18 18 16 18   Temp: 97.8 F (36.6 C) 98.1 F (36.7 C) 97.7 F (36.5 C) 97.6 F (36.4 C)  TempSrc: Oral Oral    SpO2: 96% 98% 95% 92%  Weight:       General: Not in acute distress HEENT:       Eyes: PERRL, EOMI, no scleral icterus.       ENT: No discharge from the ears and nose, no pharynx injection, no tonsillar enlargement.        Neck: No JVD, no bruit, no mass felt. Heme: No neck lymph node enlargement. Cardiac: S1/S2, RRR, No murmurs, No gallops or rubs. Respiratory: has coarse breathing sounds bilaterally GI: Soft, nondistended, has tenderness in RUQ, no rebound pain, no organomegaly, BS present. GU: No hematuria Ext: No pitting leg edema bilaterally. 1+DP/PT pulse bilaterally. Musculoskeletal: No joint deformities, No joint redness or warmth, no limitation of ROM in spin. Skin: No rashes.  Neuro: Alert, oriented X3, cranial nerves Miller-XII grossly intact, moves all extremities normally.  Psych: Patient is not psychotic, no suicidal or hemocidal ideation.  Labs on Admission: I have personally reviewed following labs and imaging studies  CBC: Recent Labs  Lab 05/30/21 0616 05/31/21 0506 06/03/21 0710  WBC 13.0* 10.5 12.7*  NEUTROABS 7.4  --   --   HGB 20.4* 16.0 19.6*  HCT 57.4* 45.8 55.5*  MCV 90.7 93.7 88.9  PLT 299 221 302   Basic Metabolic Panel: Recent Labs  Lab 05/30/21 0616 06/03/21 0710  NA 141 136  K 3.5 3.8  CL 104 106  CO2 27 18*  GLUCOSE 130* 130*  BUN 19 15  CREATININE 1.24 0.82  CALCIUM 10.1 9.5  MG 1.8 1.5*  PHOS  --  3.4   GFR: Estimated Creatinine Clearance: 115.2 mL/min (by C-G formula based on SCr of 0.82 mg/dL). Liver Function Tests: Recent Labs  Lab 05/30/21 0616 06/03/21 0710  AST 26 29  ALT 17 32   ALKPHOS 107 79  BILITOT 1.0 1.1  PROT 8.5* 7.3  ALBUMIN 4.7 4.0   Recent Labs  Lab 05/30/21 0616 06/03/21 0710  LIPASE 25 22   No results for input(s): AMMONIA in the last 168 hours. Coagulation Profile: No results for input(s): INR, PROTIME in the last 168 hours. Cardiac Enzymes: No results for input(s): CKTOTAL, CKMB, CKMBINDEX, TROPONINI in the last 168 hours. BNP (last 3 results) No results for input(s): PROBNP in the last 8760 hours. HbA1C: No results for input(s): HGBA1C in the last 72 hours. CBG: No results for input(s): GLUCAP in the last 168 hours. Lipid Profile: No results for input(s): CHOL, HDL, LDLCALC, TRIG, CHOLHDL, LDLDIRECT in the last 72 hours. Thyroid Function Tests: No results for input(s): TSH, T4TOTAL, FREET4, T3FREE, THYROIDAB in the last 72 hours. Anemia Panel: No results for input(s): VITAMINB12, FOLATE,  FERRITIN, TIBC, IRON, RETICCTPCT in the last 72 hours. Urine analysis:    Component Value Date/Time   COLORURINE YELLOW (A) 06/03/2019 1405   APPEARANCEUR CLEAR (A) 06/03/2019 1405   LABSPEC >1.046 (H) 06/03/2019 1405   PHURINE 6.0 06/03/2019 1405   GLUCOSEU NEGATIVE 06/03/2019 1405   HGBUR MODERATE (A) 06/03/2019 1405   BILIRUBINUR NEGATIVE 06/03/2019 1405   KETONESUR NEGATIVE 06/03/2019 1405   PROTEINUR NEGATIVE 06/03/2019 1405   NITRITE NEGATIVE 06/03/2019 1405   LEUKOCYTESUR NEGATIVE 06/03/2019 1405   Sepsis Labs: @LABRCNTIP (procalcitonin:4,lacticidven:4) ) Recent Results (from the past 240 hour(s))  Resp Panel by RT-PCR (Flu A&B, Covid) Nasopharyngeal Swab     Status: None   Collection Time: 05/30/21  6:44 AM   Specimen: Nasopharyngeal Swab; Nasopharyngeal(NP) swabs in vial transport medium  Result Value Ref Range Status   SARS Coronavirus 2 by RT PCR NEGATIVE NEGATIVE Final    Comment: (NOTE) SARS-CoV-2 target nucleic acids are NOT DETECTED.  The SARS-CoV-2 RNA is generally detectable in upper respiratory specimens during the  acute phase of infection. The lowest concentration of SARS-CoV-2 viral copies this assay can detect is 138 copies/mL. A negative result does not preclude SARS-Cov-2 infection and should not be used as the sole basis for treatment or other patient management decisions. A negative result may occur with  improper specimen collection/handling, submission of specimen other than nasopharyngeal swab, presence of viral mutation(s) within the areas targeted by this assay, and inadequate number of viral copies(<138 copies/mL). A negative result must be combined with clinical observations, patient history, and epidemiological information. The expected result is Negative.  Fact Sheet for Patients:  BloggerCourse.comhttps://www.fda.gov/media/152166/download  Fact Sheet for Healthcare Providers:  SeriousBroker.ithttps://www.fda.gov/media/152162/download  This test is no t yet approved or cleared by the [place] FDA and  has been authorized for detection and/or diagnosis of SARS-CoV-2 by FDA under an Emergency Use Authorization (EUA). This EUA will remain  in effect (meaning this test can be used) for the duration of the COVID-19 declaration under Section 564(b)(1) of the Act, 21 U.S.C.section 360bbb-3(b)(1), unless the authorization is terminated  or revoked sooner.       Influenza A by PCR NEGATIVE NEGATIVE Final   Influenza B by PCR NEGATIVE NEGATIVE Final    Comment: (NOTE) The Xpert Xpress SARS-CoV-2/FLU/RSV plus assay is intended as an aid in the diagnosis of influenza from Nasopharyngeal swab specimens and should not be used as a sole basis for treatment. Nasal washings and aspirates are unacceptable for Xpert Xpress SARS-CoV-2/FLU/RSV testing.  Fact Sheet for Patients: BloggerCourse.comhttps://www.fda.gov/media/152166/download  Fact Sheet for Healthcare Providers: SeriousBroker.ithttps://www.fda.gov/media/152162/download  This test is not yet approved or cleared by the [place] FDA and has been authorized for detection and/or  diagnosis of SARS-CoV-2 by FDA under an Emergency Use Authorization (EUA). This EUA will remain in effect (meaning this test can be used) for the duration of the COVID-19 declaration under Section 564(b)(1) of the Act, 21 U.S.C. section 360bbb-3(b)(1), unless the authorization is terminated or revoked.  Performed at [facility], [address] [place], [place] [id number]   Gastrointestinal Panel by PCR , Stool     Status: Abnormal   Collection Time: 05/30/21  9:19 AM   Specimen: STOOL  Result Value Ref Range Status   Campylobacter species NOT DETECTED NOT DETECTED Final   Plesimonas shigelloides NOT DETECTED NOT DETECTED Final   Salmonella species NOT DETECTED NOT DETECTED Final   Yersinia enterocolitica NOT DETECTED NOT DETECTED Final   Vibrio species NOT DETECTED NOT  DETECTED Final   Vibrio cholerae NOT DETECTED NOT DETECTED Final   Enteroaggregative E coli (EAEC) NOT DETECTED NOT DETECTED Final   Enteropathogenic E coli (EPEC) DETECTED (A) NOT DETECTED Final    Comment: RESULT CALLED TO, READ BACK BY AND VERIFIED WITH: Leanora Ivanoff, RN AT 1136 ON 05/31/21 BY GM    Enterotoxigenic E coli (ETEC) NOT DETECTED NOT DETECTED Final   Shiga like toxin producing E coli (STEC) NOT DETECTED NOT DETECTED Final   Shigella/Enteroinvasive E coli (EIEC) NOT DETECTED NOT DETECTED Final   Cryptosporidium NOT DETECTED NOT DETECTED Final   Cyclospora cayetanensis NOT DETECTED NOT DETECTED Final   Entamoeba histolytica NOT DETECTED NOT DETECTED Final   Giardia lamblia NOT DETECTED NOT DETECTED Final   Adenovirus F40/41 NOT DETECTED NOT DETECTED Final   Astrovirus NOT DETECTED NOT DETECTED Final   Norovirus GI/GII NOT DETECTED NOT DETECTED Final   Rotavirus A NOT DETECTED NOT DETECTED Final   Sapovirus (I, Miller, IV, and V) NOT DETECTED NOT DETECTED Final    Comment: Performed at [facility], [address] [place], [place] [address]  Culture, blood (routine x 2)     Status:  None (Preliminary result)   Collection Time: 05/30/21 10:42 AM   Specimen: BLOOD LEFT HAND  Result Value Ref Range Status   Specimen Description BLOOD LEFT HAND  Final   Special Requests   Final    BOTTLES DRAWN AEROBIC AND ANAEROBIC Blood Culture adequate volume   Culture   Final    NO GROWTH 4 DAYS Performed at [facility], [address] [place], [place] [address]    Report Status PENDING  Incomplete  Culture, blood (routine x 2)     Status: None (Preliminary result)   Collection Time: 05/30/21 10:50 AM   Specimen: BLOOD RIGHT HAND  Result Value Ref Range Status   Specimen Description BLOOD RIGHT HAND  Final   Special Requests   Final    BOTTLES DRAWN AEROBIC AND ANAEROBIC Blood Culture adequate volume   Culture   Final    NO GROWTH 4 DAYS Performed at [facility], [address] [place], [place] [address]    Report Status PENDING  Incomplete  C Difficile Quick Screen w PCR reflex     Status: None   Collection Time: 05/31/21  9:19 AM   Specimen: STOOL  Result Value Ref Range Status   C Diff antigen NEGATIVE NEGATIVE Final   C Diff toxin NEGATIVE NEGATIVE Final   C Diff interpretation No C. difficile detected.  Final    Comment: Performed at [facility], [address] [place], [place] [address]  Resp Panel by RT-PCR (Flu A&B, Covid) Nasopharyngeal Swab     Status: None   Collection Time: 06/03/21  7:52 AM   Specimen: Nasopharyngeal Swab; Nasopharyngeal(NP) swabs in vial transport medium  Result Value Ref Range Status   SARS Coronavirus 2 by RT PCR NEGATIVE NEGATIVE Final    Comment: (NOTE) SARS-CoV-2 target nucleic acids are NOT DETECTED.  The SARS-CoV-2 RNA is generally detectable in upper respiratory specimens during the acute phase of infection. The lowest concentration of SARS-CoV-2 viral copies this assay can detect is 138 copies/mL. A negative result does not preclude SARS-Cov-2 infection and should not be used as the sole basis  for treatment or other patient management decisions. A negative result may occur with  improper specimen collection/handling, submission of specimen other than nasopharyngeal swab, presence of viral mutation(s) within the areas targeted  by this assay, and inadequate number of viral copies(<138 copies/mL). A negative result must be combined with clinical observations, patient history, and epidemiological information. The expected result is Negative.  Fact Sheet for Patients:  BloggerCourse.com  Fact Sheet for Healthcare Providers:  SeriousBroker.it  This test is no t yet approved or cleared by the [place] FDA and  has been authorized for detection and/or diagnosis of SARS-CoV-2 by FDA under an Emergency Use Authorization (EUA). This EUA will remain  in effect (meaning this test can be used) for the duration of the COVID-19 declaration under Section 564(b)(1) of the Act, 21 U.S.C.section 360bbb-3(b)(1), unless the authorization is terminated  or revoked sooner.       Influenza A by PCR NEGATIVE NEGATIVE Final   Influenza B by PCR NEGATIVE NEGATIVE Final    Comment: (NOTE) The Xpert Xpress SARS-CoV-2/FLU/RSV plus assay is intended as an aid in the diagnosis of influenza from Nasopharyngeal swab specimens and should not be used as a sole basis for treatment. Nasal washings and aspirates are unacceptable for Xpert Xpress SARS-CoV-2/FLU/RSV testing.  Fact Sheet for Patients: BloggerCourse.com  Fact Sheet for Healthcare Providers: SeriousBroker.it  This test is not yet approved or cleared by the [place] FDA and has been authorized for detection and/or diagnosis of SARS-CoV-2 by FDA under an Emergency Use Authorization (EUA). This EUA will remain in effect (meaning this test can be used) for the duration of the COVID-19 declaration under Section 564(b)(1) of the Act, 21  U.S.C. section 360bbb-3(b)(1), unless the authorization is terminated or revoked.  Performed at [facility], [address] [place], [place] [address]      Radiological Exams on Admission: DG Chest 2 View  Result Date: 06/03/2021 CLINICAL DATA:  55 year old male with history of weakness, vomiting and diarrhea. Chest pain and shortness of breath. EXAM: CHEST - 2 VIEW COMPARISON:  Chest x-ray 05/30/2021. FINDINGS: Lung volumes are normal. No consolidative airspace disease. No pleural effusions. No pneumothorax. No pulmonary nodule or mass noted. Pulmonary vasculature and the cardiomediastinal silhouette are within normal limits. Status post left shoulder arthroplasty. IMPRESSION: No radiographic evidence of acute cardiopulmonary disease. Electronically Signed   By: Trudie Reed M.D.   On: 06/03/2021 08:00     EKG: I have personally reviewed.  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  Blood pressure soft.  -Admitted to progressive unit as inpatient - IV Rocephin and azithromycin  - Mucinex for cough  - Bronchodilators - Urine legionella and S. pneumococcal antigen - Follow up blood culture x2, sputum culture - will get Procalcitonin and trend lactic acid level per sepsis protocol - IVF: 2.5L of LR bolus in ED,  followed by 100 mL per hour of NS  Atrial fibrillation with RVR: HR up to 180s.  Cardizem drip started, currently heart rate is  in the 80s. CHA2DS2-VASc Score 3, need chronic anticoagulants.  Patient used to be on Eliquis, but stopped taking this medication 1 year ago.  Patient states that he could not afford his medication.  He would like to restart this medication if we can find resources for him. -Restart Eliquis -Consult transitional care team for medication assistance. -Continue Cardizem drip -Continue home metoprolol  -Potassium 3.5, will give 40 mEq of potassium chloride, targeting potassium >4.0 -Magnesium level 1.8, will give 1 g of magnesium sulfate, targeting magnesium level > 2.0  CVA (cerebral vascular accident) (HCC) -Crestor -Restarted Eliquis  Hypertension -IV hydralazine as needed -Metoprolol -Hold lisinopril due to softer blood pressure  Elevated troponin and chest pain: Patient has mild central chest pain. Troponin level 29 --> 36.  Likely due to demand ischemia. -As needed morphine -Patient is allergic to aspirin -Crestor -Trend troponin -Check UDS, A1c, FLP  Tobacco abuse -Nicotine patch  RUQ abdominal pain: Possibly due to right lower lobe pneumonia.  Patient has normal liver function.  Right upper quadrant ultrasound is negative for acute issues. -As needed morphine -Supportive care  Nausea vomiting and diarrhea: -check C diff and GI path panel -prn zofra -IVF  Hypothyroidism -Synthroid  Depression -Cymbalta         DVT ppx: on  Eliquis Code Status: Full code Family Communication:  Yes, patient's wife at bed side Disposition Plan:  Anticipate discharge back to previous environment Consults called:  none Admission status and Level of care: Med-Surg:  as inpt       Status is: Inpatient  Remains inpatient appropriate because:Inpatient level of care appropriate due to severity of illness  Dispo: The patient is from: Home               Anticipated d/c is to: Home              Patient currently is not medically stable to d/c.   Difficult to place patient No         Date of Service 06/03/2021    Lorretta Harp [facility]   If 7PM-7AM, please contact night-coverage [email or web address] 06/03/2021, 12:27 PM

## 2021-05-30 NOTE — Sepsis Progress Note (Signed)
eLink is monitoring this Code Sepsis. °

## 2021-05-30 NOTE — ED Triage Notes (Signed)
Pt in with sharp RUQ pain that began 1 wk ago. States n/v/d since, hard to breathe. Afib 170-180's in triage. C/o mild R cp

## 2021-05-31 DIAGNOSIS — R112 Nausea with vomiting, unspecified: Secondary | ICD-10-CM

## 2021-05-31 DIAGNOSIS — R197 Diarrhea, unspecified: Secondary | ICD-10-CM

## 2021-05-31 DIAGNOSIS — R6520 Severe sepsis without septic shock: Secondary | ICD-10-CM

## 2021-05-31 DIAGNOSIS — A040 Enteropathogenic Escherichia coli infection: Secondary | ICD-10-CM

## 2021-05-31 LAB — LIPID PANEL
Cholesterol: 92 mg/dL (ref 0–200)
HDL: 26 mg/dL — ABNORMAL LOW (ref >40)
LDL Cholesterol: 47 mg/dL (ref 0–99)
Total CHOL/HDL Ratio: 3.5 RATIO
Triglycerides: 93 mg/dL (ref <150)
VLDL: 19 mg/dL (ref 0–40)

## 2021-05-31 LAB — GASTROINTESTINAL PANEL BY PCR, STOOL (REPLACES STOOL CULTURE)

## 2021-05-31 LAB — HEMOGLOBIN A1C
Hgb A1c MFr Bld: 6.3 % — ABNORMAL HIGH (ref 4.8–5.6)
Mean Plasma Glucose: 134 mg/dL

## 2021-05-31 LAB — CBC
HCT: 45.8 % (ref 39.0–52.0)
Hemoglobin: 16 g/dL (ref 13.0–17.0)
MCH: 32.7 pg (ref 26.0–34.0)
MCHC: 34.9 g/dL (ref 30.0–36.0)
MCV: 93.7 fL (ref 80.0–100.0)
Platelets: 221 10*3/uL (ref 150–400)
RBC: 4.89 MIL/uL (ref 4.22–5.81)
RDW: 13.1 % (ref 11.5–15.5)
WBC: 10.5 10*3/uL (ref 4.0–10.5)
nRBC: 0 % (ref 0.0–0.2)

## 2021-05-31 LAB — C DIFFICILE QUICK SCREEN W PCR REFLEX
C Diff antigen: NEGATIVE
C Diff interpretation: NOT DETECTED
C Diff toxin: NEGATIVE

## 2021-05-31 MED ORDER — CEFUROXIME AXETIL 500 MG PO TABS
500.0000 mg | ORAL_TABLET | Freq: Two times a day (BID) | ORAL | Status: DC
  Administered 2021-05-31: 500 mg via ORAL
  Filled 2021-05-31 (×2): qty 1

## 2021-05-31 MED ORDER — ALUM & MAG HYDROXIDE-SIMETH 200-200-20 MG/5ML PO SUSP
30.0000 mL | Freq: Four times a day (QID) | ORAL | Status: DC | PRN
  Administered 2021-05-31 – 2021-06-01 (×2): 30 mL via ORAL
  Filled 2021-05-31 (×2): qty 30

## 2021-05-31 MED ORDER — MORPHINE SULFATE (PF) 2 MG/ML IV SOLN
2.0000 mg | Freq: Four times a day (QID) | INTRAVENOUS | Status: DC | PRN
  Administered 2021-05-31 – 2021-06-01 (×4): 2 mg via INTRAVENOUS
  Filled 2021-05-31 (×4): qty 1

## 2021-05-31 MED ORDER — CEFDINIR 300 MG PO CAPS: 300.0000 mg | ORAL_CAPSULE | Freq: Two times a day (BID) | ORAL | Status: DC

## 2021-05-31 MED ORDER — SODIUM CHLORIDE 0.9 % IV SOLN: INTRAVENOUS | Status: AC

## 2021-05-31 MED ORDER — LISINOPRIL 5 MG PO TABS
5.0000 mg | ORAL_TABLET | Freq: Every day | ORAL | Status: DC
  Administered 2021-06-01 – 2021-06-02 (×2): 5 mg via ORAL
  Filled 2021-05-31 (×2): qty 1

## 2021-05-31 NOTE — Progress Notes (Signed)
[facility]  - [place] at [facility]   PATIENT NAME: Marc Miller    MR#:  [id number]  DATE OF BIRTH:  01/23/1966  SUBJECTIVE:  patient came in with nausea vomiting and diarrhea for since last Tuesday. Have some diarrhea. So far had three episodes this morning. No fever.  REVIEW OF SYSTEMS:   Review of Systems  Constitutional:  Negative for chills, fever and weight loss.  HENT:  Negative for ear discharge, ear pain and nosebleeds.   Eyes:  Negative for blurred vision, pain and discharge.  Respiratory:  Negative for sputum production, shortness of breath, wheezing and stridor.   Cardiovascular:  Negative for chest pain, palpitations, orthopnea and PND.  Gastrointestinal:  Positive for abdominal pain, diarrhea and nausea. Negative for vomiting.  Genitourinary:  Negative for frequency and urgency.  Musculoskeletal:  Negative for back pain and joint pain.  Neurological:  Positive for weakness. Negative for sensory change, speech change and focal weakness.  Psychiatric/Behavioral:  Negative for depression and hallucinations. The patient is not nervous/anxious.   Tolerating Diet:some soft Tolerating PT:   DRUG ALLERGIES:   Allergies  Allergen Reactions   Bupropion Other (See Comments)    irritability   Aspirin Other (See Comments)    Other reaction(s): gi upset Other reaction(s): gi upset    Methimazole Rash    VITALS:  Blood pressure 108/74, pulse 68, temperature 97.9 F (36.6 C), temperature source Oral, resp. rate 17, weight 93 kg, SpO2 92 %.  PHYSICAL EXAMINATION:   Physical Exam  GENERAL:  55 y.o.-year-old patient lying in the bed with no acute distress.  LUNGS: Normal breath sounds bilaterally, no wheezing, rales, rhonchi. No use of accessory muscles of respiration.  CARDIOVASCULAR: S1, S2 normal. No murmurs, rubs, or gallops.  ABDOMEN: Soft, nontender, nondistended. Bowel sounds present. No organomegaly or mass.  EXTREMITIES: No  cyanosis, clubbing or edema b/l.    NEUROLOGIC: non focal PSYCHIATRIC:  patient is alert and oriented x 3.  SKIN: No obvious rash, lesion, or ulcer.   LABORATORY PANEL:  CBC Recent Labs  Lab 05/31/21 0506  WBC 10.5  HGB 16.0  HCT 45.8  PLT 221    Chemistries  Recent Labs  Lab 05/30/21 0616  NA 141  K 3.5  CL 104  CO2 27  GLUCOSE 130*  BUN 19  CREATININE 1.24  CALCIUM 10.1  MG 1.8  AST 26  ALT 17  ALKPHOS 107  BILITOT 1.0   Cardiac Enzymes No results for input(s): TROPONINI in the last 168 hours. RADIOLOGY:  CT Chest Wo Contrast  Result Date: 05/30/2021 CLINICAL DATA:  55 year old male with right upper quadrant pain, right lung base opacity on CT Abdomen and Pelvis earlier today. EXAM: CT CHEST WITHOUT CONTRAST TECHNIQUE: Multidetector CT imaging of the chest was performed following the standard protocol without IV contrast. COMPARISON:  CT Abdomen and Pelvis 0914 hours today. Portable chest 0835 hours. FINDINGS: Cardiovascular: No cardiomegaly or pericardial effusion. Calcified coronary artery atherosclerosis on series 2, image 73. Little to no calcified plaque of the thoracic aorta. Mediastinum/Nodes: Mildly enlarged precarinal lymph node measuring 14 mm short axis. Small but increased in number mediastinal nodes elsewhere. No definite hilar lymphadenopathy. Lungs/Pleura: Upper lung paraseptal emphysema. Major airways are patent. Confluent tree in bud solid and sub solid peribronchial opacity in the right lower lobe posterior basal segment on series 3, image 103. Less involvement of the other right lower lobe segments. No consolidation or pleural effusion. Scattered mild subpleural pulmonary scarring  elsewhere. No other acute or suspicious pulmonary opacity. Upper Abdomen: Stable, negative visible upper abdomen; IV contrast being excreted from the kidneys. Musculoskeletal: No acute osseous abnormality identified. IMPRESSION: 1. Right lower lobe Bronchopneumonia; segmental  peribronchial nodular opacity. No consolidation or pleural effusion. Reactive appearing mediastinal lymph nodes. 2. Underlying  Emphysema (ICD10-J43.9). 3. Calcified coronary artery atherosclerosis. Electronically Signed   By: Odessa Fleming M.D.   On: 05/30/2021 10:40   CT ABDOMEN PELVIS W CONTRAST  Result Date: 05/30/2021 CLINICAL DATA:  Acute right upper quadrant abdominal pain. EXAM: CT ABDOMEN AND PELVIS WITH CONTRAST TECHNIQUE: Multidetector CT imaging of the abdomen and pelvis was performed using the standard protocol following bolus administration of intravenous contrast. CONTRAST:  OMNIPAQUE IOHEXOL 350 MG/ML SOLN COMPARISON:  June 03, 2019. FINDINGS: Lower chest: Mild right posterior basilar infiltrate or atelectasis is noted. Hepatobiliary: No focal liver abnormality is seen. No gallstones, gallbladder wall thickening, or biliary dilatation. Pancreas: Unremarkable. No pancreatic ductal dilatation or surrounding inflammatory changes. Spleen: Normal in size without focal abnormality. Adrenals/Urinary Tract: Adrenal glands are unremarkable. Kidneys are normal, without renal calculi, focal lesion, or hydronephrosis. Bladder is unremarkable. Stomach/Bowel: Stomach is within normal limits. Appendix appears normal. No evidence of bowel wall thickening, distention, or inflammatory changes. Vascular/Lymphatic: No significant vascular findings are present. No enlarged abdominal or pelvic lymph nodes. Reproductive: Prostate is unremarkable. Other: No abdominal wall hernia or abnormality. No abdominopelvic ascites. Musculoskeletal: No acute or significant osseous findings. IMPRESSION: Mild right posterior basilar subsegmental atelectasis or infiltrate is noted. No other abnormality seen in the abdomen or pelvis. Electronically Signed   By: Lupita Raider M.D.   On: 05/30/2021 09:49   DG Chest Portable 1 View  Result Date: 05/30/2021 CLINICAL DATA:  Low stats per ordering notes. Pt in with sharp RUQ pain that  began 1 wk ago. States n/v/d since, hard to breathe. Afib 170-180's in triage. C/o mild R cp EXAM: PORTABLE CHEST - 1 VIEW COMPARISON:  12/24/2013 FINDINGS: Lungs are clear. Heart size and mediastinal contours are within normal limits. No effusion.  No pneumothorax. Left shoulder arthroplasty components partially visualized. IMPRESSION: No acute cardiopulmonary disease. Electronically Signed   By: Corlis Leak M.D.   On: 05/30/2021 08:42   US ABDOMEN LIMITED RUQ (LIVER/GB)  Result Date: 05/30/2021 CLINICAL DATA:  Epigastric pain, nausea, vomiting x1 week EXAM: ULTRASOUND ABDOMEN LIMITED RIGHT UPPER QUADRANT COMPARISON:  CT 06/03/2019 FINDINGS: Gallbladder: No gallstones or wall thickening visualized. No sonographic Murphy sign noted by sonographer. Common bile duct: Diameter: 2.9 mm, unremarkable Liver: No focal lesion identified. Within normal limits in parenchymal echogenicity. Portal vein is patent on color Doppler imaging with normal direction of blood flow towards the liver. Other: None. IMPRESSION: Negative Electronically Signed   By: Corlis Leak M.D.   On: 05/30/2021 07:32   ASSESSMENT AND PLAN:  Marc Miller is a 55 y.o. male with medical history significant of hypertension, stroke, hypothyroidism, depression, atrial fibrillation stopped taking Eliquis 1 year ago, tobacco abuse, who presents with cough, shortness breath, chest pain, nausea, vomiting, diarrhea, abdominal pain.  Patient states that he has been sick for more than 1 week  Severe sepsis suspected due to EPEC diarrhea -clinically no  s/o PNA --d/c IV abxs  --Patient has severe sepsis with WBC 13.0, tachycardia with heart rate up to 180s, tachypnea with RR 34, lactic acid elevated at 2.2.  Blood pressure soft. --sepsis resolving -- continue IV fluids. -- continue symptomatic care. Avoid antiviral agents.  No indication for antibiotic    Atrial fibrillation with RVR: HR up to 180s.   --was on IV Cardizem drip started,  currently heart rate is in the 80s. --now on po metoprolol. D/c CCB gtt --CHA2DS2-VASc Score 3, need chronic anticoagulants.  Patient used to be on Eliquis, but stopped taking this medication 1 year ago.  Patient states that he could not afford his medication. -Restart Eliquis -Continue home metoprolol  --replete electrolytes   CVA (cerebral vascular accident) (HCC) -Crestor -Restarted Eliquis   Hypertension -IV hydralazine as needed -Metoprolol -Hold lisinopril due to softer blood pressure   Elevated troponin and chest pain: Patient has mild central chest pain. Troponin level 29 --> 36.  Likely due to demand ischemia.   Tobacco abuse -Nicotine patch   RUQ abdominal pain  Patient has normal liver function.  Right upper quadrant ultrasound is negative for acute issues. -As needed morphine -Supportive care    Hypothyroidism -Synthroid   Depression -Cymbalta     DVT ppx: on  Eliquis Code Status: Full code Family Communication:  none today Disposition Plan:  Anticipate discharge back to previous environment Consults called:  none Admission status and Level of care: Progressive Cardiac:  as inpt         Status is: Inpatient   Remains inpatient appropriate because:Inpatient level of care appropriate due to severity of illness   Dispo: The patient is from: Home              Anticipated d/c is to: Home 1-2 days              Patient currently is not medically stable to d/c.              Difficult to place patient No      TOTAL TIME TAKING CARE OF THIS PATIENT: 25 minutes.  >50% time spent on counselling and coordination of care  Note: This dictation was prepared with Dragon dictation along with smaller phrase technology. Any transcriptional errors that result from this process are unintentional.  Enedina Finner M.D    [facility]   CC: Primary care physician; [place], [place] [facility] Patient ID: Marc Miller, male   DOB: [birth year], 55  y.o.   MRN: [id number]

## 2021-05-31 NOTE — Progress Notes (Signed)
Unable to send stool, due to expired order. Per provider morning team to reorder

## 2021-06-01 ENCOUNTER — Other Ambulatory Visit: Payer: Self-pay

## 2021-06-01 DIAGNOSIS — A040 Enteropathogenic Escherichia coli infection: Secondary | ICD-10-CM

## 2021-06-01 NOTE — Progress Notes (Signed)
[facility]  - [place] at [facility]   PATIENT NAME: Marc Miller    MR#:  [id number]  DATE OF BIRTH:  Nov 23, 1965  SUBJECTIVE:  patient came in with nausea vomiting and diarrhea for since last Tuesday. Have some diarrhea. No fever.  Family at bedside Feeling a bit better today  REVIEW OF SYSTEMS:   Review of Systems  Constitutional:  Negative for chills, fever and weight loss.  HENT:  Negative for ear discharge, ear pain and nosebleeds.   Eyes:  Negative for blurred vision, pain and discharge.  Respiratory:  Negative for sputum production, shortness of breath, wheezing and stridor.   Cardiovascular:  Negative for chest pain, palpitations, orthopnea and PND.  Gastrointestinal:  Positive for abdominal pain, diarrhea and nausea. Negative for vomiting.  Genitourinary:  Negative for frequency and urgency.  Musculoskeletal:  Negative for back pain and joint pain.  Neurological:  Positive for weakness. Negative for sensory change, speech change and focal weakness.  Psychiatric/Behavioral:  Negative for depression and hallucinations. The patient is not nervous/anxious.   Tolerating Diet:some soft Tolerating PT:   DRUG ALLERGIES:   Allergies  Allergen Reactions   Bupropion Other (See Comments)    irritability   Aspirin Other (See Comments)    Other reaction(s): gi upset Other reaction(s): gi upset    Methimazole Rash    VITALS:  Blood pressure 108/73, pulse 73, temperature 98.3 F (36.8 C), resp. rate 18, weight 93 kg, SpO2 95 %.  PHYSICAL EXAMINATION:   Physical Exam  GENERAL:  55 y.o.-year-old patient lying in the bed with no acute distress.  LUNGS: Normal breath sounds bilaterally, no wheezing, rales, rhonchi. No use of accessory muscles of respiration.  CARDIOVASCULAR: S1, S2 normal. No murmurs, rubs, or gallops.  ABDOMEN: Soft, nontender,  mildlydistended. Bowel sounds present. No organomegaly or mass.  EXTREMITIES: No cyanosis, clubbing  or edema b/l.    NEUROLOGIC: non focal PSYCHIATRIC:  patient is alert and oriented x 3.  SKIN: No obvious rash, lesion, or ulcer.   LABORATORY PANEL:  CBC Recent Labs  Lab 05/31/21 0506  WBC 10.5  HGB 16.0  HCT 45.8  PLT 221     Chemistries  Recent Labs  Lab 05/30/21 0616  NA 141  K 3.5  CL 104  CO2 27  GLUCOSE 130*  BUN 19  CREATININE 1.24  CALCIUM 10.1  MG 1.8  AST 26  ALT 17  ALKPHOS 107  BILITOT 1.0    Cardiac Enzymes No results for input(s): TROPONINI in the last 168 hours. RADIOLOGY:  No results found. ASSESSMENT AND PLAN:  Trevon Strothers Miller is a 55 y.o. male with medical history significant of hypertension, stroke, hypothyroidism, depression, atrial fibrillation stopped taking Eliquis 1 year ago, tobacco abuse, who presents with cough, shortness breath, chest pain, nausea, vomiting, diarrhea, abdominal pain.  Patient states that he has been sick for more than 1 week  Severe sepsis suspected due to EPEC diarrhea -clinically no  s/o PNA --d/c IV abxs  --Patient has severe sepsis with WBC 13.0, tachycardia with heart rate up to 180s, tachypnea with RR 34, lactic acid elevated at 2.2.  Blood pressure soft. --sepsis resolving -- continue IV fluids. -- continue symptomatic care. Avoid antiviral agents. No indication for antibiotic --sepsis improving    Atrial fibrillation with RVR: HR up to 180s.   --was on IV Cardizem drip started, currently heart rate is in the 80s. --now on po metoprolol. D/c CCB gtt --CHA2DS2-VASc Score  3, need chronic anticoagulants.  Patient used to be on Eliquis, but stopped taking this medication 1 year ago.  Patient states that he could not afford his medication. -Restart Eliquis -Continue home metoprolol  --replete electrolytes   CVA (cerebral vascular accident) (HCC) -Crestor -Restarted Eliquis   Hypertension -IV hydralazine as needed - cont Metoprolol -Hold lisinopril due to soft blood pressure   Elevated  troponin and chest pain: Patient has mild central chest pain. Troponin level 29 --> 36.  Likely due to demand ischemia.   Tobacco abuse -Nicotine patch   RUQ abdominal pain  Patient has normal liver function.  Right upper quadrant ultrasound is negative for acute issues. -As needed morphine -Supportive care    Hypothyroidism -Synthroid   Depression -Cymbalta     DVT ppx: on  Eliquis Code Status: Full code Family Communication:  wife at bedside Disposition Plan:  Anticipate discharge back to previous environment Consults called:  none Admission status and Level of care: Progressive Cardiac:  as inpt         Status is: Inpatient   Remains inpatient appropriate because:Inpatient level of care appropriate due to severity of illness   Dispo: The patient is from: Home              Anticipated d/c is to: Home 1-2 days              Patient currently is not medically stable to d/c.              Difficult to place patient No      TOTAL TIME TAKING CARE OF THIS PATIENT: 25 minutes.  >50% time spent on counselling and coordination of care  Note: This dictation was prepared with Dragon dictation along with smaller phrase technology. Any transcriptional errors that result from this process are unintentional.  Enedina Finner M.D    [facility]   CC: Primary care physician; [place], [place] [facility] Patient ID: Marc Miller, male   DOB: Jun 02, 1966, 55 y.o.   MRN: [id number]

## 2021-06-02 ENCOUNTER — Other Ambulatory Visit: Payer: Self-pay

## 2021-06-02 MED ORDER — APIXABAN 5 MG PO TABS
5.0000 mg | ORAL_TABLET | Freq: Two times a day (BID) | ORAL | 2 refills | Status: AC
  Filled 2021-06-02: qty 60, 30d supply, fill #0

## 2021-06-02 NOTE — Discharge Instructions (Signed)
Continue to hydrate with water and gatorade like fluids

## 2021-06-02 NOTE — Discharge Summary (Signed)
[facility] - [place] at [facility]   PATIENT NAME: Marc Miller    MR#:  [id number]  DATE OF BIRTH:  [birth year]  DATE OF ADMISSION:  05/30/2021 ADMITTING PHYSICIAN: Lorretta Harp, MD  DATE OF DISCHARGE: 06/02/2021  PRIMARY CARE PHYSICIAN: [place] [place] [facility]    ADMISSION DIAGNOSIS:  Bronchopneumonia [J18.0] CAP (community acquired pneumonia) [J18.9] Atrial fibrillation with rapid ventricular response (HCC) [I48.91] Upper abdominal pain [R10.10] Chest pain, unspecified type [R07.9] Nausea and vomiting, intractability of vomiting not specified, unspecified vomiting type [R11.2]  DISCHARGE DIAGNOSIS:  Acute EPEC gastroenteritis  SECONDARY DIAGNOSIS:   Past Medical History:  Diagnosis Date   Atrial fibrillation (HCC)    reported by patient   Chronic pain    CVA (cerebral vascular accident) (HCC)    reported by patient   Depression    HLD (hyperlipidemia)    Hypertension    Hypothyroidism    Thyroid disease     HOSPITAL COURSE:  Marc Miller is a 55 y.o. male with medical history significant of hypertension, stroke, hypothyroidism, depression, atrial fibrillation stopped taking Eliquis 1 year ago, tobacco abuse, who presents with cough, shortness breath, chest pain, nausea, vomiting, diarrhea, abdominal pain.  Patient states that he has been sick for more than 1 week   Severe sepsis suspected due to EPEC diarrhea -clinically no  s/o PNA --d/c IV abxs  --Patient has severe sepsis with WBC 13.0, tachycardia with heart rate up to 180s, tachypnea with RR 34, lactic acid elevated at 2.2.  Blood pressure soft. --sepsis resolving -- recieved IV fluids. -- continue symptomatic care. Avoid antidiarrheal  agents. No indication for antibiotic --sepsis improving    Atrial fibrillation with RVR: HR up to 180s.  --now resolved --was on IV Cardizem drip started, currently heart rate is in the 80s. --on po metoprolol. D/c CCB  gtt --CHA2DS2-VASc Score 3, need chronic anticoagulants.  Patient used to be on Eliquis, but stopped taking this medication 1 year ago.  Patient states that he could not afford his medication. -Restart Eliquis -Continue home metoprolol  --replete electrolytes --Eliquis thru med mgmt clinic--pt aware   CVA (cerebral vascular accident) (HCC) -Crestor -Restarted Eliquis   Hypertension - cont Metoprolol --resumed lisinopril   Elevated troponin and chest pain: Patient has mild central chest pain. Troponin level 29 --> 36.  Likely due to demand ischemia.   Tobacco abuse -Nicotine patch   RUQ abdominal pain  Patient has normal liver function.  Right upper quadrant ultrasound is negative for acute issues. -As needed morphine -Supportive care    Hypothyroidism -Synthroid   Depression -Cymbalta     DVT ppx: on  Eliquis Code Status: Full code Family Communication:  wife at bedside 7/31 Disposition Plan:  Anticipate discharge back to previous environment Consults called:  none Admission status and Level of care: Progressive Cardiac:  as inpt         Status is: Inpatient   Remains inpatient appropriate because:Inpatient level of care appropriate due to severity of illness   Dispo: The patient is from: Home              Anticipated d/c is to: Home  today              Patient currently is improving and okay to go home              Difficult to place patient  CONSULTS OBTAINED:    DRUG ALLERGIES:   Allergies  Allergen  Reactions   Bupropion Other (See Comments)    irritability   Aspirin Other (See Comments)    Other reaction(s): gi upset Other reaction(s): gi upset    Methimazole Rash    DISCHARGE MEDICATIONS:   Allergies as of 06/02/2021       Reactions   Bupropion Other (See Comments)   irritability   Aspirin Other (See Comments)   Other reaction(s): gi upset Other reaction(s): gi upset   Methimazole Rash        Medication List     TAKE these  medications    apixaban 5 MG Tabs tablet Commonly known as: ELIQUIS Take 1 tablet (5 mg total) by mouth 2 (two) times daily.   cyclobenzaprine 10 MG tablet Commonly known as: FLEXERIL Take 10 mg by mouth 3 (three) times daily as needed for muscle spasms.   DULoxetine 60 MG capsule Commonly known as: CYMBALTA Take 60 mg by mouth daily.   levothyroxine 75 MCG tablet Commonly known as: SYNTHROID Take 75 mcg by mouth daily before breakfast.   lisinopril 5 MG tablet Commonly known as: ZESTRIL Take 5 mg by mouth daily.   metoprolol tartrate 100 MG tablet Commonly known as: LOPRESSOR Take 100 mg by mouth 2 (two) times daily.   pregabalin 75 MG capsule Commonly known as: LYRICA Take 75 mg by mouth 3 (three) times daily.   rosuvastatin 10 MG tablet Commonly known as: CRESTOR Take 10 mg by mouth daily.        If you experience worsening of your admission symptoms, develop shortness of breath, life threatening emergency, suicidal or homicidal thoughts you must seek medical attention immediately by calling 911 or calling your MD immediately  if symptoms less severe.  You Must read complete instructions/literature along with all the possible adverse reactions/side effects for all the Medicines you take and that have been prescribed to you. Take any new Medicines after you have completely understood and accept all the possible adverse reactions/side effects.   Please note  You were cared for by a hospitalist during your hospital stay. If you have any questions about your discharge medications or the care you received while you were in the hospital after you are discharged, you can call the unit and asked to speak with the hospitalist on call if the hospitalist that took care of you is not available. Once you are discharged, your primary care physician will handle any further medical issues. Please note that NO REFILLS for any discharge medications will be authorized once you are  discharged, as it is imperative that you return to your primary care physician (or establish a relationship with a primary care physician if you do not have one) for your aftercare needs so that they can reassess your need for medications and monitor your lab values. Today   SUBJECTIVE  no stool output documented by staff. Patient sees he still has some diarrhea. Able to tolerate full liquid. Does not like it much wants soft diet. No fever. No nausea vomiting   VITAL SIGNS:  Blood pressure 111/69, pulse (!) 59, temperature 97.7 F (36.5 C), resp. rate 16, weight 93 kg, SpO2 95 %.  I/O:   Intake/Output Summary (Last 24 hours) at 06/02/2021 0900 Last data filed at 06/01/2021 2000 Gross per 24 hour  Intake 360 ml  Output --  Net 360 ml    PHYSICAL EXAMINATION:  GENERAL:  55 y.o.-year-old patient lying in the bed with no acute distress. LUNGS: Normal breath sounds bilaterally, no wheezing,  rales, rhonchi. No use of accessory muscles of respiration. CARDIOVASCULAR: S1, S2 normal. No murmurs, rubs, or gallops. ABDOMEN: Soft, nontender,  mildlydistended. Bowel sounds present. No organomegaly or mass. EXTREMITIES: No cyanosis, clubbing or edema b/l.    NEUROLOGIC: non focal PSYCHIATRIC:  patient is alert and oriented x 3. SKIN: No obvious rash, lesion, or ulcer.   DATA REVIEW:   CBC  Recent Labs  Lab 05/31/21 0506  WBC 10.5  HGB 16.0  HCT 45.8  PLT 221    Chemistries  Recent Labs  Lab 05/30/21 0616  NA 141  K 3.5  CL 104  CO2 27  GLUCOSE 130*  BUN 19  CREATININE 1.24  CALCIUM 10.1  MG 1.8  AST 26  ALT 17  ALKPHOS 107  BILITOT 1.0    Microbiology Results   Recent Results (from the past 240 hour(s))  Resp Panel by RT-PCR (Flu A&B, Covid) Nasopharyngeal Swab     Status: None   Collection Time: 05/30/21  6:44 AM   Specimen: Nasopharyngeal Swab; Nasopharyngeal(NP) swabs in vial transport medium  Result Value Ref Range Status   SARS Coronavirus 2 by RT PCR NEGATIVE  NEGATIVE Final    Comment: (NOTE) SARS-CoV-2 target nucleic acids are NOT DETECTED.  The SARS-CoV-2 RNA is generally detectable in upper respiratory specimens during the acute phase of infection. The lowest concentration of SARS-CoV-2 viral copies this assay can detect is 138 copies/mL. A negative result does not preclude SARS-Cov-2 infection and should not be used as the sole basis for treatment or other patient management decisions. A negative result may occur with  improper specimen collection/handling, submission of specimen other than nasopharyngeal swab, presence of viral mutation(s) within the areas targeted by this assay, and inadequate number of viral copies(<138 copies/mL). A negative result must be combined with clinical observations, patient history, and epidemiological information. The expected result is Negative.  Fact Sheet for Patients:  BloggerCourse.com  Fact Sheet for Healthcare Providers:  SeriousBroker.it  This test is no t yet approved or cleared by the [place] FDA and  has been authorized for detection and/or diagnosis of SARS-CoV-2 by FDA under an Emergency Use Authorization (EUA). This EUA will remain  in effect (meaning this test can be used) for the duration of the COVID-19 declaration under Section 564(b)(1) of the Act, 21 U.S.C.section 360bbb-3(b)(1), unless the authorization is terminated  or revoked sooner.       Influenza A by PCR NEGATIVE NEGATIVE Final   Influenza B by PCR NEGATIVE NEGATIVE Final    Comment: (NOTE) The Xpert Xpress SARS-CoV-2/FLU/RSV plus assay is intended as an aid in the diagnosis of influenza from Nasopharyngeal swab specimens and should not be used as a sole basis for treatment. Nasal washings and aspirates are unacceptable for Xpert Xpress SARS-CoV-2/FLU/RSV testing.  Fact Sheet for Patients: BloggerCourse.com  Fact Sheet for Healthcare  Providers: SeriousBroker.it  This test is not yet approved or cleared by the [place] FDA and has been authorized for detection and/or diagnosis of SARS-CoV-2 by FDA under an Emergency Use Authorization (EUA). This EUA will remain in effect (meaning this test can be used) for the duration of the COVID-19 declaration under Section 564(b)(1) of the Act, 21 U.S.C. section 360bbb-3(b)(1), unless the authorization is terminated or revoked.  Performed at [facility], [address], [place], [place] [address]   Gastrointestinal Panel by PCR , Stool     Status: Abnormal   Collection Time: 05/30/21  9:19 AM   Specimen: STOOL  Result Value Ref Range Status   Campylobacter species NOT DETECTED NOT DETECTED Final   Plesimonas shigelloides NOT DETECTED NOT DETECTED Final   Salmonella species NOT DETECTED NOT DETECTED Final   Yersinia enterocolitica NOT DETECTED NOT DETECTED Final   Vibrio species NOT DETECTED NOT DETECTED Final   Vibrio cholerae NOT DETECTED NOT DETECTED Final   Enteroaggregative E coli (EAEC) NOT DETECTED NOT DETECTED Final   Enteropathogenic E coli (EPEC) DETECTED (A) NOT DETECTED Final    Comment: RESULT CALLED TO, READ BACK BY AND VERIFIED WITH: Leanora IvanoffLINDSEY MARTIN, RN AT 1136 ON 05/31/21 BY GM    Enterotoxigenic E coli (ETEC) NOT DETECTED NOT DETECTED Final   Shiga like toxin producing E coli (STEC) NOT DETECTED NOT DETECTED Final   Shigella/Enteroinvasive E coli (EIEC) NOT DETECTED NOT DETECTED Final   Cryptosporidium NOT DETECTED NOT DETECTED Final   Cyclospora cayetanensis NOT DETECTED NOT DETECTED Final   Entamoeba histolytica NOT DETECTED NOT DETECTED Final   Giardia lamblia NOT DETECTED NOT DETECTED Final   Adenovirus F40/41 NOT DETECTED NOT DETECTED Final   Astrovirus NOT DETECTED NOT DETECTED Final   Norovirus GI/GII NOT DETECTED NOT DETECTED Final   Rotavirus A NOT DETECTED NOT DETECTED Final   Sapovirus (I, Miller, IV, and V)  NOT DETECTED NOT DETECTED Final    Comment: Performed at [facility], [address] [place], [place] [address]  Culture, blood (routine x 2)     Status: None (Preliminary result)   Collection Time: 05/30/21 10:42 AM   Specimen: BLOOD LEFT HAND  Result Value Ref Range Status   Specimen Description BLOOD LEFT HAND  Final   Special Requests   Final    BOTTLES DRAWN AEROBIC AND ANAEROBIC Blood Culture adequate volume   Culture   Final    NO GROWTH 3 DAYS Performed at [facility], [address] [place], [place] [address]    Report Status PENDING  Incomplete  Culture, blood (routine x 2)     Status: None (Preliminary result)   Collection Time: 05/30/21 10:50 AM   Specimen: BLOOD RIGHT HAND  Result Value Ref Range Status   Specimen Description BLOOD RIGHT HAND  Final   Special Requests   Final    BOTTLES DRAWN AEROBIC AND ANAEROBIC Blood Culture adequate volume   Culture   Final    NO GROWTH 3 DAYS Performed at [facility], [address] [place], [place] [address]    Report Status PENDING  Incomplete  C Difficile Quick Screen w PCR reflex     Status: None   Collection Time: 05/31/21  9:19 AM   Specimen: STOOL  Result Value Ref Range Status   C Diff antigen NEGATIVE NEGATIVE Final   C Diff toxin NEGATIVE NEGATIVE Final   C Diff interpretation No C. difficile detected.  Final    Comment: Performed at [facility], [address] [place], [place] [address]    RADIOLOGY:  No results found.   CODE STATUS:     Code Status Orders  (From admission, onward)           Start     Ordered   05/30/21 1604  Full code  Continuous        05/30/21 1603           Code Status History     This patient has a current code status but no historical code status.        TOTAL TIME TAKING CARE  OF THIS PATIENT: 25 minutes.    Enedina Finner M.D  [facility]    CC: Primary care physician; [place], [place] [facility]

## 2021-06-02 NOTE — TOC Progression Note (Signed)
Transition of Care [facility]) - Progression Note    Patient Details  Name: Marc Miller MRN: [id number] Date of Birth: 1966/07/24  [facility] CM/SW Contact  Caryn Section, RN Phone Number: 06/02/2021, 9:56 AM  Clinical Narrative:   Patient lives at home with his wife, states she can assist him on discharge.  He has PCP with [facility], and has no concerns with transportation to appointments or picking up medications.  Patient states he has no concerns about taking medications as directed.  [name] has no home health services at this time and states he does not anticipate needing any of these services.   Patient reports he only has family planning medicaid.  Marc Miller at [facility] contacted.  Per Ms. Marc Miller, Medicaid application is in progress, but not finalized as of yet.  Per discharging physician, patient's medications have been sent to Medication Management to assist with medication regimen, toc to deliver meds to patient's room. Eliquis coupon was given to patient to assist with medication compliance as well.   TOC contact information given, TOC will follow up for needs          Expected Discharge Plan and Services           Expected Discharge Date: 06/02/21                                     Social Determinants of Health (SDOH) Interventions    Readmission Risk Interventions No flowsheet data found.

## 2021-06-03 ENCOUNTER — Encounter: Payer: Self-pay | Admitting: Internal Medicine

## 2021-06-03 ENCOUNTER — Other Ambulatory Visit: Payer: Self-pay

## 2021-06-03 ENCOUNTER — Inpatient Hospital Stay
Admission: EM | Admit: 2021-06-03 | Discharge: 2021-06-06 | DRG: 872 | Disposition: A | Discharge status: Home / Self Care | Payer: Medicaid Other | Attending: Internal Medicine | Admitting: Internal Medicine

## 2021-06-03 ENCOUNTER — Emergency Department: Payer: Medicaid Other

## 2021-06-03 DIAGNOSIS — A040 Enteropathogenic Escherichia coli infection: Secondary | ICD-10-CM | POA: Diagnosis present

## 2021-06-03 DIAGNOSIS — Z79890 Hormone replacement therapy (postmenopausal): Secondary | ICD-10-CM

## 2021-06-03 DIAGNOSIS — Z20822 Contact with and (suspected) exposure to covid-19: Secondary | ICD-10-CM | POA: Diagnosis present

## 2021-06-03 DIAGNOSIS — K529 Noninfective gastroenteritis and colitis, unspecified: Secondary | ICD-10-CM

## 2021-06-03 DIAGNOSIS — Z7901 Long term (current) use of anticoagulants: Secondary | ICD-10-CM

## 2021-06-03 DIAGNOSIS — E039 Hypothyroidism, unspecified: Secondary | ICD-10-CM | POA: Diagnosis present

## 2021-06-03 DIAGNOSIS — A4151 Sepsis due to Escherichia coli [E. coli]: Principal | ICD-10-CM | POA: Diagnosis present

## 2021-06-03 DIAGNOSIS — I4891 Unspecified atrial fibrillation: Secondary | ICD-10-CM | POA: Diagnosis present

## 2021-06-03 DIAGNOSIS — F17210 Nicotine dependence, cigarettes, uncomplicated: Secondary | ICD-10-CM | POA: Diagnosis present

## 2021-06-03 DIAGNOSIS — E860 Dehydration: Secondary | ICD-10-CM | POA: Diagnosis present

## 2021-06-03 DIAGNOSIS — F32A Depression, unspecified: Secondary | ICD-10-CM | POA: Diagnosis present

## 2021-06-03 DIAGNOSIS — E785 Hyperlipidemia, unspecified: Secondary | ICD-10-CM | POA: Diagnosis present

## 2021-06-03 DIAGNOSIS — Z8673 Personal history of transient ischemic attack (TIA), and cerebral infarction without residual deficits: Secondary | ICD-10-CM

## 2021-06-03 DIAGNOSIS — Z886 Allergy status to analgesic agent status: Secondary | ICD-10-CM

## 2021-06-03 DIAGNOSIS — G894 Chronic pain syndrome: Secondary | ICD-10-CM | POA: Diagnosis present

## 2021-06-03 DIAGNOSIS — I639 Cerebral infarction, unspecified: Secondary | ICD-10-CM | POA: Diagnosis present

## 2021-06-03 DIAGNOSIS — I10 Essential (primary) hypertension: Secondary | ICD-10-CM | POA: Diagnosis present

## 2021-06-03 DIAGNOSIS — Z8249 Family history of ischemic heart disease and other diseases of the circulatory system: Secondary | ICD-10-CM

## 2021-06-03 DIAGNOSIS — Z720 Tobacco use: Secondary | ICD-10-CM | POA: Diagnosis present

## 2021-06-03 DIAGNOSIS — Z79899 Other long term (current) drug therapy: Secondary | ICD-10-CM

## 2021-06-03 DIAGNOSIS — A419 Sepsis, unspecified organism: Secondary | ICD-10-CM | POA: Diagnosis present

## 2021-06-03 DIAGNOSIS — Z888 Allergy status to other drugs, medicaments and biological substances status: Secondary | ICD-10-CM

## 2021-06-03 LAB — CBC
HCT: 55.5 % — ABNORMAL HIGH (ref 39.0–52.0)
Hemoglobin: 19.6 g/dL — ABNORMAL HIGH (ref 13.0–17.0)
MCH: 31.4 pg (ref 26.0–34.0)
MCHC: 35.3 g/dL (ref 30.0–36.0)
MCV: 88.9 fL (ref 80.0–100.0)
Platelets: 302 10*3/uL (ref 150–400)
RBC: 6.24 MIL/uL — ABNORMAL HIGH (ref 4.22–5.81)
RDW: 12.4 % (ref 11.5–15.5)
WBC: 12.7 10*3/uL — ABNORMAL HIGH (ref 4.0–10.5)
nRBC: 0 % (ref 0.0–0.2)

## 2021-06-03 LAB — HEPATIC FUNCTION PANEL
ALT: 32 U/L (ref 0–44)
AST: 29 U/L (ref 15–41)
Albumin: 4 g/dL (ref 3.5–5.0)
Alkaline Phosphatase: 79 U/L (ref 38–126)
Bilirubin, Direct: 0.2 mg/dL (ref 0.0–0.2)
Indirect Bilirubin: 0.9 mg/dL (ref 0.3–0.9)
Total Bilirubin: 1.1 mg/dL (ref 0.3–1.2)
Total Protein: 7.3 g/dL (ref 6.5–8.1)

## 2021-06-03 LAB — BASIC METABOLIC PANEL
Anion gap: 12 (ref 5–15)
BUN: 15 mg/dL (ref 6–20)
CO2: 18 mmol/L — ABNORMAL LOW (ref 22–32)
Calcium: 9.5 mg/dL (ref 8.9–10.3)
Chloride: 106 mmol/L (ref 98–111)
Creatinine, Ser: 0.82 mg/dL (ref 0.61–1.24)
GFR, Estimated: >60 mL/min (ref >60)
Glucose, Bld: 130 mg/dL — ABNORMAL HIGH (ref 70–99)
Potassium: 3.8 mmol/L (ref 3.5–5.1)
Sodium: 136 mmol/L (ref 135–145)

## 2021-06-03 LAB — RESP PANEL BY RT-PCR (FLU A&B, COVID) ARPGX2
Influenza A by PCR: NEGATIVE
Influenza B by PCR: NEGATIVE
SARS Coronavirus 2 by RT PCR: NEGATIVE

## 2021-06-03 LAB — LACTIC ACID, PLASMA
Lactic Acid, Venous: 1 mmol/L (ref 0.5–1.9)
Lactic Acid, Venous: 1.2 mmol/L (ref 0.5–1.9)

## 2021-06-03 LAB — MAGNESIUM: Magnesium: 1.5 mg/dL — ABNORMAL LOW (ref 1.7–2.4)

## 2021-06-03 LAB — PHOSPHORUS: Phosphorus: 3.4 mg/dL (ref 2.5–4.6)

## 2021-06-03 LAB — PROCALCITONIN: Procalcitonin: <0.1 ng/mL

## 2021-06-03 LAB — TROPONIN I (HIGH SENSITIVITY): Troponin I (High Sensitivity): 10 ng/L (ref <18)

## 2021-06-03 LAB — LIPASE, BLOOD: Lipase: 22 U/L (ref 11–51)

## 2021-06-03 MED ORDER — METOPROLOL TARTRATE 50 MG PO TABS
100.0000 mg | ORAL_TABLET | Freq: Two times a day (BID) | ORAL | Status: DC
  Administered 2021-06-03 – 2021-06-06 (×6): 100 mg via ORAL
  Filled 2021-06-03 (×6): qty 2

## 2021-06-03 MED ORDER — ROSUVASTATIN CALCIUM 10 MG PO TABS
10.0000 mg | ORAL_TABLET | Freq: Every day | ORAL | Status: DC
  Administered 2021-06-04 – 2021-06-06 (×3): 10 mg via ORAL
  Filled 2021-06-03 (×3): qty 1

## 2021-06-03 MED ORDER — LEVOTHYROXINE SODIUM 50 MCG PO TABS
75.0000 ug | ORAL_TABLET | Freq: Every day | ORAL | Status: DC
  Administered 2021-06-04 – 2021-06-06 (×3): 75 ug via ORAL
  Filled 2021-06-03 (×2): qty 1
  Filled 2021-06-03: qty 2

## 2021-06-03 MED ORDER — CYCLOBENZAPRINE HCL 10 MG PO TABS: 10.0000 mg | ORAL_TABLET | Freq: Three times a day (TID) | ORAL | Status: DC | PRN

## 2021-06-03 MED ORDER — LISINOPRIL 5 MG PO TABS
5.0000 mg | ORAL_TABLET | Freq: Every day | ORAL | Status: DC
  Administered 2021-06-03 – 2021-06-05 (×3): 5 mg via ORAL
  Filled 2021-06-03 (×3): qty 1

## 2021-06-03 MED ORDER — PREGABALIN 75 MG PO CAPS
75.0000 mg | ORAL_CAPSULE | Freq: Three times a day (TID) | ORAL | Status: DC
  Administered 2021-06-03 – 2021-06-06 (×8): 75 mg via ORAL
  Filled 2021-06-03 (×8): qty 1

## 2021-06-03 MED ORDER — LACTATED RINGERS IV BOLUS
1000.0000 mL | Freq: Once | INTRAVENOUS | Status: AC
  Administered 2021-06-03: 1000 mL via INTRAVENOUS

## 2021-06-03 MED ORDER — LACTATED RINGERS IV BOLUS
1500.0000 mL | Freq: Once | INTRAVENOUS | Status: AC
  Administered 2021-06-03: 1500 mL via INTRAVENOUS

## 2021-06-03 MED ORDER — FAMOTIDINE IN NACL 20-0.9 MG/50ML-% IV SOLN: 20.0000 mg | Freq: Two times a day (BID) | INTRAVENOUS | Status: DC

## 2021-06-03 MED ORDER — HYDRALAZINE HCL 20 MG/ML IJ SOLN: 5.0000 mg | INTRAMUSCULAR | Status: DC | PRN

## 2021-06-03 MED ORDER — POTASSIUM CHLORIDE CRYS ER 20 MEQ PO TBCR
40.0000 meq | EXTENDED_RELEASE_TABLET | Freq: Once | ORAL | Status: DC
Start: 2021-06-03 — End: 2021-06-06
  Filled 2021-06-03: qty 2

## 2021-06-03 MED ORDER — DULOXETINE HCL 30 MG PO CPEP
60.0000 mg | ORAL_CAPSULE | Freq: Every day | ORAL | Status: DC
  Administered 2021-06-03 – 2021-06-06 (×4): 60 mg via ORAL
  Filled 2021-06-03 (×2): qty 2
  Filled 2021-06-03 (×2): qty 1

## 2021-06-03 MED ORDER — MORPHINE SULFATE (PF) 4 MG/ML IV SOLN
4.0000 mg | Freq: Once | INTRAVENOUS | Status: AC
Start: 2021-06-03 — End: 2021-06-03
  Administered 2021-06-03: 4 mg via INTRAVENOUS
  Filled 2021-06-03: qty 1

## 2021-06-03 MED ORDER — MAGNESIUM SULFATE 2 GM/50ML IV SOLN
2.0000 g | Freq: Once | INTRAVENOUS | Status: AC
  Administered 2021-06-03: 2 g via INTRAVENOUS
  Filled 2021-06-03: qty 50

## 2021-06-03 MED ORDER — DILTIAZEM HCL 25 MG/5ML IV SOLN
10.0000 mg | Freq: Once | INTRAVENOUS | Status: AC
  Administered 2021-06-03: 10 mg via INTRAVENOUS
  Filled 2021-06-03: qty 5

## 2021-06-03 MED ORDER — LEVOTHYROXINE SODIUM 50 MCG PO TABS: 75.0000 ug | ORAL_TABLET | Freq: Every day | ORAL | Status: DC

## 2021-06-03 MED ORDER — FAMOTIDINE IN NACL 20-0.9 MG/50ML-% IV SOLN
20.0000 mg | Freq: Two times a day (BID) | INTRAVENOUS | Status: DC
  Administered 2021-06-03 – 2021-06-05 (×5): 20 mg via INTRAVENOUS
  Filled 2021-06-03 (×6): qty 50

## 2021-06-03 MED ORDER — DILTIAZEM HCL-DEXTROSE 125-5 MG/125ML-% IV SOLN (PREMIX)
5.0000 mg/h | INTRAVENOUS | Status: DC
  Administered 2021-06-03: 5 mg/h via INTRAVENOUS
  Filled 2021-06-03: qty 125

## 2021-06-03 MED ORDER — ACETAMINOPHEN 160 MG/5ML PO SOLN
650.0000 mg | Freq: Four times a day (QID) | ORAL | Status: DC | PRN
  Filled 2021-06-03: qty 20.3

## 2021-06-03 MED ORDER — APIXABAN 5 MG PO TABS
5.0000 mg | ORAL_TABLET | Freq: Two times a day (BID) | ORAL | Status: DC
  Administered 2021-06-03 – 2021-06-06 (×6): 5 mg via ORAL
  Filled 2021-06-03 (×6): qty 1

## 2021-06-03 MED ORDER — MORPHINE SULFATE (PF) 2 MG/ML IV SOLN
2.0000 mg | INTRAVENOUS | Status: DC | PRN
  Administered 2021-06-03 – 2021-06-05 (×10): 2 mg via INTRAVENOUS
  Filled 2021-06-03 (×11): qty 1

## 2021-06-03 MED ORDER — NICOTINE 21 MG/24HR TD PT24
21.0000 mg | MEDICATED_PATCH | Freq: Every day | TRANSDERMAL | Status: DC
  Filled 2021-06-03 (×2): qty 1

## 2021-06-03 MED ORDER — ONDANSETRON HCL 4 MG/2ML IJ SOLN
4.0000 mg | Freq: Three times a day (TID) | INTRAMUSCULAR | Status: DC | PRN
Start: 2021-06-03 — End: 2021-06-06
  Administered 2021-06-03 – 2021-06-04 (×2): 4 mg via INTRAVENOUS
  Filled 2021-06-03 (×4): qty 2

## 2021-06-03 MED ORDER — SODIUM CHLORIDE 0.9 % IV SOLN: INTRAVENOUS | Status: DC

## 2021-06-03 NOTE — ED Provider Notes (Signed)
[facility] Emergency Department Provider Note   ____________________________________________   Event Date/Time   First MD Initiated Contact with Patient 06/03/21 [phone]     (approximate)  I have reviewed the triage vital signs and the nursing notes.   HISTORY  Chief Complaint Emesis    HPI Marc Miller is a 55 y.o. male with past medical history of hypertension, hyperlipidemia, stroke, atrial fibrillation on Eliquis, and chronic pain syndrome who presents to the ED complaining of nausea and vomiting.  Patient was recently admitted to the hospital due to EPEC gastroenteritis with associated atrial fibrillation with RVR, subsequently discharged yesterday.  He states he was feeling well at the time of discharge, took a nap but had worsening nausea with multiple episodes of vomiting after waking up.  He had multiple episodes of diarrhea overnight, denies any blood in his emesis or stool.  He tried to take his medications for atrial fibrillation, but states he was unable to keep them down.  He has started to feel like his heart is racing after the vomiting began, denies any chest pain or shortness of breath.  He denies any fevers, cough, dysuria, flank pain, or hematuria.        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    CAP (community acquired pneumonia) 05/30/2021   Bronchopneumonia 05/30/2021   Nausea and vomiting 05/30/2021   Acute respiratory failure with hypoxia (HCC) 05/30/2021   Hypothyroidism 05/30/2021   Depression 05/30/2021   HLD  (hyperlipidemia) 05/30/2021   CVA (cerebral vascular accident) (HCC)    Hypertension    Atrial fibrillation with rapid ventricular response (HCC)    Severe sepsis (HCC)    Elevated troponin    Tobacco abuse    RUQ abdominal pain    Chest pain     Past Surgical History:  Procedure Laterality Date   SHOULDER SURGERY Left     Prior to Admission medications   Medication Sig Start Date End Date Taking? Authorizing Provider  apixaban (ELIQUIS) 5 MG TABS tablet Take 1 tablet (5 mg total) by mouth 2 (two) times daily. 06/02/21   Enedina Finner, MD  cyclobenzaprine (FLEXERIL) 10 MG tablet Take 10 mg by mouth 3 (three) times daily as needed for muscle spasms.    [provider]  DULoxetine (CYMBALTA) 60 MG capsule Take 60 mg by mouth daily.    [provider]  levothyroxine (SYNTHROID) 75 MCG tablet Take 75 mcg by mouth daily before breakfast.    [provider]  lisinopril (ZESTRIL) 5 MG tablet Take 5 mg by mouth daily.    [provider]  metoprolol tartrate (LOPRESSOR) 100 MG tablet Take 100 mg by mouth 2 (two) times daily.    [provider]  pregabalin (LYRICA) 75 MG capsule Take 75 mg by mouth 3 (three) times daily.    [provider]  rosuvastatin (CRESTOR) 10 MG tablet Take 10 mg by mouth daily.    [provider]    Allergies Bupropion, Aspirin,  and Methimazole  No family history on file.  Social History Social History   Tobacco Use   Smoking status: Every Day    Packs/day: 0.25    Types: Cigarettes   Smokeless tobacco: Never  Vaping Use   Vaping Use: Never used  Substance Use Topics   Alcohol use: Not Currently   Drug use: Never    Review of Systems  Constitutional: No fever/chills Eyes: No visual changes. ENT: No sore throat. Cardiovascular: Denies chest pain.  Positive for palpitations. Respiratory: Denies shortness of breath. Gastrointestinal: Positive for abdominal pain, nausea, vomiting, and  diarrhea.  No constipation. Genitourinary: Negative for dysuria. Musculoskeletal: Negative for back pain. Skin: Negative for rash. Neurological: Negative for headaches, focal weakness or numbness.  ____________________________________________   PHYSICAL EXAM:  VITAL SIGNS: ED Triage Vitals  Enc Vitals Group     BP 06/03/21 0705 (!) 119/94     Pulse Rate 06/03/21 0705 87     Resp 06/03/21 0705 20     Temp 06/03/21 0705 97.9 F (36.6 C)     Temp Source 06/03/21 0705 Oral     SpO2 06/03/21 0705 95 %     Weight 06/03/21 0706 205 lb (93 kg)     Height 06/03/21 0706 5\' 8"  (1.727 m)     Head Circumference --      Peak Flow --      Pain Score 06/03/21 0705 8     Pain Loc --      Pain Edu? --      Excl. in GC? --     Constitutional: Alert and oriented. Eyes: Conjunctivae are normal. Head: Atraumatic. Nose: No congestion/rhinnorhea. Mouth/Throat: Mucous membranes are moist. Neck: Normal ROM Cardiovascular: Tachycardic, irregularly irregular rhythm. Grossly normal heart sounds.  2+ radial pulses bilaterally. Respiratory: Normal respiratory effort.  No retractions. Lungs CTAB. Gastrointestinal: Soft and nontender. No distention. Genitourinary: deferred Musculoskeletal: No lower extremity tenderness nor edema. Neurologic:  Normal speech and language. No gross focal neurologic deficits are appreciated. Skin:  Skin is warm, dry and intact. No rash noted. Psychiatric: Mood and affect are normal. Speech and behavior are normal.  ____________________________________________   LABS (all labs ordered are listed, but only abnormal results are displayed)  Labs Reviewed  BASIC METABOLIC PANEL - Abnormal; Notable for the following components:      Result Value   CO2 18 (*)    Glucose, Bld 130 (*)    All other components within normal limits  CBC - Abnormal; Notable for the following components:   WBC 12.7 (*)    RBC 6.24 (*)    Hemoglobin 19.6 (*)    HCT 55.5 (*)    All other  components within normal limits  MAGNESIUM - Abnormal; Notable for the following components:   Magnesium 1.5 (*)    All other components within normal limits  RESP PANEL BY RT-PCR (FLU A&B, COVID) ARPGX2  LIPASE, BLOOD  HEPATIC FUNCTION PANEL  TROPONIN I (HIGH SENSITIVITY)   ____________________________________________  EKG  ED ECG REPORT I, 08/03/21, the attending physician, personally viewed and interpreted this ECG.   Date: 06/03/2021  EKG Time: 7:08  Rate: 160  Rhythm: atrial fibrillation  Axis: Normal  Intervals:none  ST&T Change: Inferolateral ST/T changes, similar to previous   PROCEDURES  Procedure(s) performed (including Critical Care):  .Critical Care  Date/Time: 06/03/2021 9:12 AM Performed by: 08/03/2021, MD Authorized by: Chesley Noon, MD   Critical care provider statement:    Critical care time (  minutes):  45   Critical care time was exclusive of:  Separately billable procedures and treating other patients and teaching time   Critical care was necessary to treat or prevent imminent or life-threatening deterioration of the following conditions:  Cardiac failure   Critical care was time spent personally by me on the following activities:  Discussions with consultants, evaluation of patient's response to treatment, examination of patient, ordering and performing treatments and interventions, ordering and review of laboratory studies, ordering and review of radiographic studies, pulse oximetry, re-evaluation of patient's condition, obtaining history from patient or surrogate and review of old charts   I assumed direction of critical care for this patient from another provider in my specialty: no     Care discussed with: admitting provider     ____________________________________________   INITIAL IMPRESSION / ASSESSMENT AND PLAN / ED COURSE      55 year old male with past medical history of hypertension, hyperlipidemia, stroke, atrial  fibrillation on Eliquis, and chronic pain syndrome who presents to the ED with ongoing vomiting and diarrhea associated with worsening palpitations after recent admission for EPEC gastroenteritis with atrial fibrillation with RVR.  Patient again appears to be in atrial fibrillation with RVR today, heart rate varying from the 130s to the 160s.  He does appear dehydrated, we will start IV fluids along with IV Cardizem drip to control his heart rate.  We will check labs for any electrolyte abnormality or AKI, EKG does show inferior ST/T wave changes but these are similar to findings during his recent admission.  He endorses abdominal pain but has no tenderness on exam, CT scan from 4 days ago was unremarkable and repeat does not seem indicated at this time as pain is likely attributable to his gastroenteritis.  Heart rate improving following IV fluids and Cardizem drip, labs remarkable for hypomagnesemia but otherwise reassuring.  Plan to discuss with hospitalist for admission.      ____________________________________________   FINAL CLINICAL IMPRESSION(S) / ED DIAGNOSES  Final diagnoses:  Atrial fibrillation with RVR (HCC)  Gastroenteritis     ED Discharge Orders     None        Note:  This document was prepared using Dragon voice recognition software and may include unintentional dictation errors.    Chesley Noon, MD 06/03/21 [phone]

## 2021-06-03 NOTE — H&P (Signed)
History and Physical    Marc Miller KGM:[id number] DOB: Apr 26, 1966 DOA: 06/03/2021  Referring MD/NP/PA:   PCP: [facility]   Patient coming from:  The patient is coming from home.  At baseline, pt is independent for most of ADL.        Chief Complaint: Nausea, vomiting, diarrhea  HPI: Marc Miller is a 55 y.o. male with medical history significant of hypertension, stroke, hypothyroidism, depression, atrial fibrillation on Eliquis, tobacco abuse, who presents with nausea, vomiting, diarrhea.  Patient was hospitalized from 7/29-8/1 due to severe sepsis 2/2 EPEC diarrhea and A fib with RVR. Pt was treated with IV fluid and discharged at improved condition.  Patient states that after he went home, initially he was doing fine, but then continued to have nausea, vomiting and diarrhea.  Patient states that he has had 3 times of nonbilious nonbloody vomiting, 18 times of watery diarrhea.  Patient has mild lower abdominal pain.  No symptoms of UTI.  No fever or chills.  Patient states that he had some shortness of breath, and mild chest pain earlier, which has resolved.  Currently no chest pain or shortness breath.  Patient has mild dry cough.  Patient was found to have A. fib with RVR with heart rates up to 165, Cardizem drip started.  ED Course: pt was found to have WBC 12.7, troponin level 10, pending COVID-19 PCR, renal function okay, temperature 97.9, blood pressure 134/91, RR 21, oxygen saturation 92-98% on room air.  Chest x-ray negative.  Patient is placed on progressive bed for observation.  Review of Systems:   General: no fevers, chills, no body weight gain, has poor appetite, has fatigue HEENT: no blurry vision, hearing changes or sore throat Respiratory: no dyspnea, coughing, wheezing CV: no chest pain, no palpitations GI: has nausea, vomiting, abdominal pain, diarrhea, no constipation GU: no dysuria, burning on urination,  increased urinary frequency, hematuria  Ext: no leg edema Neuro: no unilateral weakness, numbness, or tingling, no vision change or hearing loss Skin: no rash, no skin tear. MSK: No muscle spasm, no deformity, no limitation of range of movement in spin Heme: No easy bruising.  Travel history: No recent long distant travel.  Allergy:  Allergies  Allergen Reactions   Bupropion Other (See Comments)    irritability   Aspirin Other (See Comments)    Other reaction(s): gi upset Other reaction(s): gi upset    Methimazole Rash    Past Medical History:  Diagnosis Date   Atrial fibrillation (HCC)    reported by patient   Chronic pain    CVA (cerebral vascular accident) (HCC)    reported by patient   Depression    HLD (hyperlipidemia)    Hypertension    Hypothyroidism    Thyroid disease     Past Surgical History:  Procedure Laterality Date   SHOULDER SURGERY Left     Social History:  reports that he has been smoking cigarettes. He has been smoking an average of .25 packs per day. He has never used smokeless tobacco. He reports previous alcohol use. He reports that he does not use drugs.  Family History:  Family History  Problem Relation Age of Onset   Hypertension Mother    Hypertension Sister    Thyroid disease Sister      Prior to Admission medications   Medication Sig Start Date End Date Taking? Authorizing Provider  apixaban (ELIQUIS) 5 MG TABS tablet Take 1 tablet (  5 mg total) by mouth 2 (two) times daily. 06/02/21   Enedina Finner, MD  cyclobenzaprine (FLEXERIL) 10 MG tablet Take 10 mg by mouth 3 (three) times daily as needed for muscle spasms.    [provider]  DULoxetine (CYMBALTA) 60 MG capsule Take 60 mg by mouth daily.    [provider]  levothyroxine (SYNTHROID) 75 MCG tablet Take 75 mcg by mouth daily before breakfast.    [provider]  lisinopril (ZESTRIL) 5 MG tablet Take 5 mg by mouth daily.    [provider]   metoprolol tartrate (LOPRESSOR) 100 MG tablet Take 100 mg by mouth 2 (two) times daily.    [provider]  pregabalin (LYRICA) 75 MG capsule Take 75 mg by mouth 3 (three) times daily.    [provider]  rosuvastatin (CRESTOR) 10 MG tablet Take 10 mg by mouth daily.    [provider]    Physical Exam: Vitals:   06/03/21 0855 06/03/21 0910 06/03/21 0930 06/03/21 1100  BP:  (!) 142/98 (!) 132/92 124/82  Pulse: 71 (!) 117 92 86  Resp: (!) Temp:      TempSrc:      SpO2: 97% 97% 95% 96%  Weight:      Height:       General: Not in acute distress.  Dry mucous membrane HEENT:       Eyes: PERRL, EOMI, no scleral icterus.       ENT: No discharge from the ears and nose, no pharynx injection, no tonsillar enlargement.        Neck: No JVD, no bruit, no mass felt. Heme: No neck lymph node enlargement. Cardiac: S1/S2, irregularly irregular rhythm, No murmurs, No gallops or rubs. Respiratory: No rales, wheezing, rhonchi or rubs. GI: Soft, nondistended, mild tenderness in lower abdomen, no rebound pain, no organomegaly, BS present. GU: No hematuria Ext: No pitting leg edema bilaterally. 1+DP/PT pulse bilaterally. Musculoskeletal: No joint deformities, No joint redness or warmth, no limitation of ROM in spin. Skin: No rashes.  Neuro: Alert, oriented X3, cranial nerves Miller-XII grossly intact, moves all extremities normally.  Psych: Patient is not psychotic, no suicidal or hemocidal ideation.  Labs on Admission: I have personally reviewed following labs and imaging studies  CBC: Recent Labs  Lab 05/30/21 0616 05/31/21 0506 06/03/21 0710  WBC 13.0* 10.5 12.7*  NEUTROABS 7.4  --   --   HGB 20.4* 16.0 19.6*  HCT 57.4* 45.8 55.5*  MCV 90.7 93.7 88.9  PLT 299 221 302   Basic Metabolic Panel: Recent Labs  Lab 05/30/21 0616 06/03/21 0710  NA 141 136  K 3.5 3.8  CL 104 106  CO2 27 18*  GLUCOSE 130* 130*  BUN 19 15  CREATININE 1.24 0.82   CALCIUM 10.1 9.5  MG 1.8 1.5*  PHOS  --  3.4   GFR: Estimated Creatinine Clearance: 115.2 mL/min (by C-G formula based on SCr of 0.82 mg/dL). Liver Function Tests: Recent Labs  Lab 05/30/21 0616 06/03/21 0710  AST 26 29  ALT 17 32  ALKPHOS 107 79  BILITOT 1.0 1.1  PROT 8.5* 7.3  ALBUMIN 4.7 4.0   Recent Labs  Lab 05/30/21 0616 06/03/21 0710  LIPASE 25 22   No results for input(s): AMMONIA in the last 168 hours. Coagulation Profile: No results for input(s): INR, PROTIME in the last 168 hours. Cardiac Enzymes: No results for input(s): CKTOTAL, CKMB, CKMBINDEX, TROPONINI in the last 168  hours. BNP (last 3 results) No results for input(s): PROBNP in the last 8760 hours. HbA1C: No results for input(s): HGBA1C in the last 72 hours. CBG: No results for input(s): GLUCAP in the last 168 hours. Lipid Profile: No results for input(s): CHOL, HDL, LDLCALC, TRIG, CHOLHDL, LDLDIRECT in the last 72 hours. Thyroid Function Tests: No results for input(s): TSH, T4TOTAL, FREET4, T3FREE, THYROIDAB in the last 72 hours. Anemia Panel: No results for input(s): VITAMINB12, FOLATE, FERRITIN, TIBC, IRON, RETICCTPCT in the last 72 hours. Urine analysis:    Component Value Date/Time   COLORURINE YELLOW (A) 06/03/2019 1405   APPEARANCEUR CLEAR (A) 06/03/2019 1405   LABSPEC >1.046 (H) 06/03/2019 1405   PHURINE 6.0 06/03/2019 1405   GLUCOSEU NEGATIVE 06/03/2019 1405   HGBUR MODERATE (A) 06/03/2019 1405   BILIRUBINUR NEGATIVE 06/03/2019 1405   KETONESUR NEGATIVE 06/03/2019 1405   PROTEINUR NEGATIVE 06/03/2019 1405   NITRITE NEGATIVE 06/03/2019 1405   LEUKOCYTESUR NEGATIVE 06/03/2019 1405   Sepsis Labs: @LABRCNTIP (procalcitonin:4,lacticidven:4) ) Recent Results (from the past 240 hour(s))  Resp Panel by RT-PCR (Flu A&B, Covid) Nasopharyngeal Swab     Status: None   Collection Time: 05/30/21  6:44 AM   Specimen: Nasopharyngeal Swab; Nasopharyngeal(NP) swabs in vial transport medium   Result Value Ref Range Status   SARS Coronavirus 2 by RT PCR NEGATIVE NEGATIVE Final    Comment: (NOTE) SARS-CoV-2 target nucleic acids are NOT DETECTED.  The SARS-CoV-2 RNA is generally detectable in upper respiratory specimens during the acute phase of infection. The lowest concentration of SARS-CoV-2 viral copies this assay can detect is 138 copies/mL. A negative result does not preclude SARS-Cov-2 infection and should not be used as the sole basis for treatment or other patient management decisions. A negative result may occur with  improper specimen collection/handling, submission of specimen other than nasopharyngeal swab, presence of viral mutation(s) within the areas targeted by this assay, and inadequate number of viral copies(<138 copies/mL). A negative result must be combined with clinical observations, patient history, and epidemiological information. The expected result is Negative.  Fact Sheet for Patients:  BloggerCourse.comhttps://www.fda.gov/media/152166/download  Fact Sheet for Healthcare Providers:  SeriousBroker.ithttps://www.fda.gov/media/152162/download  This test is no t yet approved or cleared by the [place] FDA and  has been authorized for detection and/or diagnosis of SARS-CoV-2 by FDA under an Emergency Use Authorization (EUA). This EUA will remain  in effect (meaning this test can be used) for the duration of the COVID-19 declaration under Section 564(b)(1) of the Act, 21 U.S.C.section 360bbb-3(b)(1), unless the authorization is terminated  or revoked sooner.       Influenza A by PCR NEGATIVE NEGATIVE Final   Influenza B by PCR NEGATIVE NEGATIVE Final    Comment: (NOTE) The Xpert Xpress SARS-CoV-2/FLU/RSV plus assay is intended as an aid in the diagnosis of influenza from Nasopharyngeal swab specimens and should not be used as a sole basis for treatment. Nasal washings and aspirates are unacceptable for Xpert Xpress SARS-CoV-2/FLU/RSV testing.  Fact Sheet for  Patients: BloggerCourse.comhttps://www.fda.gov/media/152166/download  Fact Sheet for Healthcare Providers: SeriousBroker.ithttps://www.fda.gov/media/152162/download  This test is not yet approved or cleared by the [place] FDA and has been authorized for detection and/or diagnosis of SARS-CoV-2 by FDA under an Emergency Use Authorization (EUA). This EUA will remain in effect (meaning this test can be used) for the duration of the COVID-19 declaration under Section 564(b)(1) of the Act, 21 U.S.C. section 360bbb-3(b)(1), unless the authorization is terminated or revoked.  Performed at [facility], [address] [place], [place] [address]   Gastrointestinal Panel by PCR , Stool     Status: Abnormal   Collection Time: 05/30/21  9:19 AM   Specimen: STOOL  Result Value Ref Range Status   Campylobacter species NOT DETECTED NOT DETECTED Final   Plesimonas shigelloides NOT DETECTED NOT DETECTED Final   Salmonella species NOT DETECTED NOT DETECTED Final   Yersinia enterocolitica NOT DETECTED NOT DETECTED Final   Vibrio species NOT DETECTED NOT DETECTED Final   Vibrio cholerae NOT DETECTED NOT DETECTED Final   Enteroaggregative E coli (EAEC) NOT DETECTED NOT DETECTED Final   Enteropathogenic E coli (EPEC) DETECTED (A) NOT DETECTED Final    Comment: RESULT CALLED TO, READ BACK BY AND VERIFIED WITH: Leanora Ivanoff, RN AT 1136 ON 05/31/21 BY GM    Enterotoxigenic E coli (ETEC) NOT DETECTED NOT DETECTED Final   Shiga like toxin producing E coli (STEC) NOT DETECTED NOT DETECTED Final   Shigella/Enteroinvasive E coli (EIEC) NOT DETECTED NOT DETECTED Final   Cryptosporidium NOT DETECTED NOT DETECTED Final   Cyclospora cayetanensis NOT DETECTED NOT DETECTED Final   Entamoeba histolytica NOT DETECTED NOT DETECTED Final   Giardia lamblia NOT DETECTED NOT DETECTED Final   Adenovirus F40/41 NOT DETECTED NOT DETECTED Final   Astrovirus NOT DETECTED NOT DETECTED Final   Norovirus GI/GII NOT DETECTED NOT DETECTED  Final   Rotavirus A NOT DETECTED NOT DETECTED Final   Sapovirus (I, Miller, IV, and V) NOT DETECTED NOT DETECTED Final    Comment: Performed at [facility], [address] [place], [place] [address]  Culture, blood (routine x 2)     Status: None (Preliminary result)   Collection Time: 05/30/21 10:42 AM   Specimen: BLOOD LEFT HAND  Result Value Ref Range Status   Specimen Description BLOOD LEFT HAND  Final   Special Requests   Final    BOTTLES DRAWN AEROBIC AND ANAEROBIC Blood Culture adequate volume   Culture   Final    NO GROWTH 4 DAYS Performed at [facility], [address] [place], [place] [address]    Report Status PENDING  Incomplete  Culture, blood (routine x 2)     Status: None (Preliminary result)   Collection Time: 05/30/21 10:50 AM   Specimen: BLOOD RIGHT HAND  Result Value Ref Range Status   Specimen Description BLOOD RIGHT HAND  Final   Special Requests   Final    BOTTLES DRAWN AEROBIC AND ANAEROBIC Blood Culture adequate volume   Culture   Final    NO GROWTH 4 DAYS Performed at [facility], [address] [place], [place] [address]    Report Status PENDING  Incomplete  C Difficile Quick Screen w PCR reflex     Status: None   Collection Time: 05/31/21  9:19 AM   Specimen: STOOL  Result Value Ref Range Status   C Diff antigen NEGATIVE NEGATIVE Final   C Diff toxin NEGATIVE NEGATIVE Final   C Diff interpretation No C. difficile detected.  Final    Comment: Performed at [facility], [address] [place], [place] [address]  Resp Panel by RT-PCR (Flu A&B, Covid) Nasopharyngeal Swab     Status: None   Collection Time: 06/03/21  7:52 AM   Specimen: Nasopharyngeal Swab; Nasopharyngeal(NP) swabs in vial transport medium  Result Value Ref Range Status   SARS Coronavirus 2 by RT PCR NEGATIVE NEGATIVE Final    Comment: (NOTE) SARS-CoV-2 target nucleic acids are NOT DETECTED.  The  SARS-CoV-2 RNA is generally detectable in upper  respiratory specimens during the acute phase of infection. The lowest concentration of SARS-CoV-2 viral copies this assay can detect is 138 copies/mL. A negative result does not preclude SARS-Cov-2 infection and should not be used as the sole basis for treatment or other patient management decisions. A negative result may occur with  improper specimen collection/handling, submission of specimen other than nasopharyngeal swab, presence of viral mutation(s) within the areas targeted by this assay, and inadequate number of viral copies(<138 copies/mL). A negative result must be combined with clinical observations, patient history, and epidemiological information. The expected result is Negative.  Fact Sheet for Patients:  BloggerCourse.com  Fact Sheet for Healthcare Providers:  SeriousBroker.it  This test is no t yet approved or cleared by the [place] FDA and  has been authorized for detection and/or diagnosis of SARS-CoV-2 by FDA under an Emergency Use Authorization (EUA). This EUA will remain  in effect (meaning this test can be used) for the duration of the COVID-19 declaration under Section 564(b)(1) of the Act, 21 U.S.C.section 360bbb-3(b)(1), unless the authorization is terminated  or revoked sooner.       Influenza A by PCR NEGATIVE NEGATIVE Final   Influenza B by PCR NEGATIVE NEGATIVE Final    Comment: (NOTE) The Xpert Xpress SARS-CoV-2/FLU/RSV plus assay is intended as an aid in the diagnosis of influenza from Nasopharyngeal swab specimens and should not be used as a sole basis for treatment. Nasal washings and aspirates are unacceptable for Xpert Xpress SARS-CoV-2/FLU/RSV testing.  Fact Sheet for Patients: BloggerCourse.com  Fact Sheet for Healthcare Providers: SeriousBroker.it  This test is not yet approved or cleared by the [place] FDA and has been  authorized for detection and/or diagnosis of SARS-CoV-2 by FDA under an Emergency Use Authorization (EUA). This EUA will remain in effect (meaning this test can be used) for the duration of the COVID-19 declaration under Section 564(b)(1) of the Act, 21 U.S.C. section 360bbb-3(b)(1), unless the authorization is terminated or revoked.  Performed at [facility], [address], [place], [place] [address]      Radiological Exams on Admission: DG Chest 2 View  Result Date: 06/03/2021 CLINICAL DATA:  55 year old male with history of weakness, vomiting and diarrhea. Chest pain and shortness of breath. EXAM: CHEST - 2 VIEW COMPARISON:  Chest x-ray 05/30/2021. FINDINGS: Lung volumes are normal. No consolidative airspace disease. No pleural effusions. No pneumothorax. No pulmonary nodule or mass noted. Pulmonary vasculature and the cardiomediastinal silhouette are within normal limits. Status post left shoulder arthroplasty. IMPRESSION: No radiographic evidence of acute cardiopulmonary disease. Electronically Signed   By: Trudie Reed M.D.   On: 06/03/2021 08:00     EKG: I have personally reviewed.  A. 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  Currently no chest pain.  Troponin level 10. CHA2DS2-VASc Score 3, need chronic anticoagulants.  Patient was newly resarted on Eliquis during recent admission.  -Placed on progressive bed follow-up physician -Cardizem drip -Metoprolol -IV fluid and correction of hypomagnesemia -Continue Eliquis -check  TSH  Sepsis due  to EPEC gastroenteritis: Patient needs criteria for sepsis with WBC 12.7, tachycardia, tachypnea with RR 21.  No fever.  Lactic acid 1.1.  Currently hemodynamically stable -- no need for abxs  -will get Procalcitonin -IVF: 2.5L of LR bolus in ED, followed by 75 cc/h   CVA (cerebral vascular accident) (HCC) -Crestor - Eliquis   Hypertension - cont Metoprolol and lisinopril   Tobacco abuse -Nicotine patch    Hypothyroidism -Synthroid   Depression -Cymbalta  Hypomagnesemia: Mg 1.5 -give 2 g of magnesium sulfate IV -Potassium 3.8, will give 40 mEq of potassium chloride orally      DVT ppx: On Eliquis Code Status: Full code Family Communication: not done, no family member is at bed side.      Disposition Plan: anticipate discharge back to previous environment Consults called: None Admission status and Level of care: Progressive Cardiac:    for obs   Status is: Observation  The patient remains OBS appropriate and will d/c before 2 midnights.  Dispo: The patient is from: Home              Anticipated d/c is to: Home              Patient currently is not medically stable to d/c.   Difficult to place patient No         Date of Service 06/03/2021    Lorretta Harp [facility]   If 7PM-7AM, please contact night-coverage [email or web address] 06/03/2021, 11:52 AM

## 2021-06-03 NOTE — ED Notes (Signed)
Pt provided a dinner tray.  

## 2021-06-03 NOTE — ED Notes (Signed)
Pt unhooked from cardiac monitoring and ambulatory with steady gait to toilet in rm.

## 2021-06-03 NOTE — ED Triage Notes (Signed)
Pt to ED POV for emesis, diarrhea, states was d/c yesterday for same. Was admitted for a fib RVR as well, states having some cp but thinks from emesis +shob Pt alert and oriented Nad noted

## 2021-06-04 ENCOUNTER — Other Ambulatory Visit: Payer: Self-pay

## 2021-06-04 ENCOUNTER — Encounter: Payer: Self-pay | Admitting: Internal Medicine

## 2021-06-04 DIAGNOSIS — F32A Depression, unspecified: Secondary | ICD-10-CM | POA: Diagnosis present

## 2021-06-04 DIAGNOSIS — Z8249 Family history of ischemic heart disease and other diseases of the circulatory system: Secondary | ICD-10-CM | POA: Diagnosis not present

## 2021-06-04 DIAGNOSIS — Z7901 Long term (current) use of anticoagulants: Secondary | ICD-10-CM | POA: Diagnosis not present

## 2021-06-04 DIAGNOSIS — Z888 Allergy status to other drugs, medicaments and biological substances status: Secondary | ICD-10-CM | POA: Diagnosis not present

## 2021-06-04 DIAGNOSIS — A040 Enteropathogenic Escherichia coli infection: Secondary | ICD-10-CM | POA: Diagnosis present

## 2021-06-04 DIAGNOSIS — I10 Essential (primary) hypertension: Secondary | ICD-10-CM | POA: Diagnosis present

## 2021-06-04 DIAGNOSIS — A4151 Sepsis due to Escherichia coli [E. coli]: Secondary | ICD-10-CM | POA: Diagnosis not present

## 2021-06-04 DIAGNOSIS — Z886 Allergy status to analgesic agent status: Secondary | ICD-10-CM | POA: Diagnosis not present

## 2021-06-04 DIAGNOSIS — Z79899 Other long term (current) drug therapy: Secondary | ICD-10-CM | POA: Diagnosis not present

## 2021-06-04 DIAGNOSIS — K529 Noninfective gastroenteritis and colitis, unspecified: Secondary | ICD-10-CM | POA: Diagnosis not present

## 2021-06-04 DIAGNOSIS — Z79890 Hormone replacement therapy (postmenopausal): Secondary | ICD-10-CM | POA: Diagnosis not present

## 2021-06-04 DIAGNOSIS — E860 Dehydration: Secondary | ICD-10-CM | POA: Diagnosis present

## 2021-06-04 DIAGNOSIS — E039 Hypothyroidism, unspecified: Secondary | ICD-10-CM | POA: Diagnosis present

## 2021-06-04 DIAGNOSIS — Z8673 Personal history of transient ischemic attack (TIA), and cerebral infarction without residual deficits: Secondary | ICD-10-CM | POA: Diagnosis not present

## 2021-06-04 DIAGNOSIS — I4891 Unspecified atrial fibrillation: Secondary | ICD-10-CM | POA: Diagnosis present

## 2021-06-04 DIAGNOSIS — F17210 Nicotine dependence, cigarettes, uncomplicated: Secondary | ICD-10-CM | POA: Diagnosis present

## 2021-06-04 DIAGNOSIS — Z20822 Contact with and (suspected) exposure to covid-19: Secondary | ICD-10-CM | POA: Diagnosis present

## 2021-06-04 DIAGNOSIS — G894 Chronic pain syndrome: Secondary | ICD-10-CM | POA: Diagnosis present

## 2021-06-04 DIAGNOSIS — E785 Hyperlipidemia, unspecified: Secondary | ICD-10-CM | POA: Diagnosis present

## 2021-06-04 LAB — CBC
HCT: 43.8 % (ref 39.0–52.0)
Hemoglobin: 15.6 g/dL (ref 13.0–17.0)
MCH: 32.3 pg (ref 26.0–34.0)
MCHC: 35.6 g/dL (ref 30.0–36.0)
MCV: 90.7 fL (ref 80.0–100.0)
Platelets: 243 10*3/uL (ref 150–400)
RBC: 4.83 MIL/uL (ref 4.22–5.81)
RDW: 12.6 % (ref 11.5–15.5)
WBC: 9.3 10*3/uL (ref 4.0–10.5)
nRBC: 0 % (ref 0.0–0.2)

## 2021-06-04 LAB — BASIC METABOLIC PANEL
Anion gap: 6 (ref 5–15)
BUN: 9 mg/dL (ref 6–20)
CO2: 26 mmol/L (ref 22–32)
Calcium: 8 mg/dL — ABNORMAL LOW (ref 8.9–10.3)
Chloride: 105 mmol/L (ref 98–111)
Creatinine, Ser: 0.76 mg/dL (ref 0.61–1.24)
GFR, Estimated: >60 mL/min (ref >60)
Glucose, Bld: 91 mg/dL (ref 70–99)
Potassium: 3.7 mmol/L (ref 3.5–5.1)
Sodium: 137 mmol/L (ref 135–145)

## 2021-06-04 LAB — CULTURE, BLOOD (ROUTINE X 2)
Culture: NO GROWTH
Culture: NO GROWTH
Special Requests: ADEQUATE
Special Requests: ADEQUATE

## 2021-06-04 LAB — TSH: TSH: 7.255 u[IU]/mL — ABNORMAL HIGH (ref 0.350–4.500)

## 2021-06-04 LAB — MAGNESIUM: Magnesium: 1.7 mg/dL (ref 1.7–2.4)

## 2021-06-04 MED ORDER — MAGNESIUM SULFATE 2 GM/50ML IV SOLN
2.0000 g | Freq: Once | INTRAVENOUS | Status: AC
  Administered 2021-06-04: 2 g via INTRAVENOUS
  Filled 2021-06-04: qty 50

## 2021-06-04 MED ORDER — RISAQUAD PO CAPS
2.0000 | ORAL_CAPSULE | Freq: Three times a day (TID) | ORAL | Status: DC
  Administered 2021-06-04 – 2021-06-06 (×7): 2 via ORAL
  Filled 2021-06-04 (×7): qty 2

## 2021-06-04 NOTE — ED Notes (Signed)
Lunch tray delivered.

## 2021-06-04 NOTE — ED Notes (Signed)
Pt resting quietly in bed, watching TV. Bed in lowest position and locked with bed rails up X 2. No S/S of distress noted. Family member at bedside.

## 2021-06-04 NOTE — ED Notes (Signed)
Informed RN bed assigned 

## 2021-06-04 NOTE — ED Notes (Signed)
Pt ambulated to BR with steady gait.

## 2021-06-04 NOTE — Progress Notes (Signed)
PROGRESS NOTE    Marc Miller  EAV:[id number] DOB: Dec 02, 1965 DOA: 06/03/2021 PCP: [facility]    Brief Narrative:  Marc Miller is a 55 y.o. male with medical history significant of hypertension, stroke, hypothyroidism, depression, atrial fibrillation on Eliquis, tobacco abuse, who presents with nausea, vomiting, diarrhea.   Patient was hospitalized from 7/29-8/1 due to severe sepsis 2/2 EPEC diarrhea and A fib with RVR. Pt was treated with IV fluid and discharged at improved condition.  Patient states that after he went home, initially he was doing fine, but then continued to have nausea, vomiting and diarrhea.  Patient states that he has had 3 times of nonbilious nonbloody vomiting, 18 times of watery diarrhea.  Patient has mild lower abdominal pain.  No symptoms of UTI.  No fever or chills.  Patient states that he had some shortness of breath, and mild chest pain earlier, which has resolved.  Currently no chest pain or shortness breath.  Patient has mild dry cough.   Patient was found to have A. fib with RVR with heart rates up to 165, Cardizem drip started.  8/3 still with watery diarrhea today.     Consultants:    Procedures:   Antimicrobials:      Subjective: Diarrhea, no abd pain. No vomiting  Objective: Vitals:   06/04/21 1030 06/04/21 1200 06/04/21 1230 06/04/21 1416  BP: (!) 130/94 131/87 104/67 124/78  Pulse: 65 61 62 (!) 59  Resp: 14 13 18 18   Temp:    97.8 F (36.6 C)  TempSrc:    Oral  SpO2: 92% 95% 97% 94%  Weight:      Height:        Intake/Output Summary (Last 24 hours) at 06/04/2021 1501 Last data filed at 06/03/2021 2238 Gross per 24 hour  Intake 43.15 ml  Output 550 ml  Net -506.85 ml   Filed Weights   06/03/21 0706  Weight: 93 kg    Examination:  General exam: Appears calm and comfortable  Respiratory system: Clear to auscultation. Respiratory effort normal. Cardiovascular system: S1 &  S2 heard, RRR. No JVD, murmurs, rubs, gallops or clicks.  Gastrointestinal system: Abdomen is nondistended, soft and nontender. Normal bowel sounds heard. Central nervous system: grossly intact Extremities: no edema Skin: warm, dry Psychiatry: Mood & affect appropriate.     Data Reviewed: I have personally reviewed following labs and imaging studies  CBC: Recent Labs  Lab 05/30/21 0616 05/31/21 0506 06/03/21 0710 06/04/21 0625  WBC 13.0* 10.5 12.7* 9.3  NEUTROABS 7.4  --   --   --   HGB 20.4* 16.0 19.6* 15.6  HCT 57.4* 45.8 55.5* 43.8  MCV 90.7 93.7 88.9 90.7  PLT 299 221 302 243   Basic Metabolic Panel: Recent Labs  Lab 05/30/21 0616 06/03/21 0710 06/04/21 0625  NA 141 136 137  K 3.5 3.8 3.7  CL 104 106 105  CO2 27 18* 26  GLUCOSE 130* 130* 91  BUN 19 15 9   CREATININE 1.24 0.82 0.76  CALCIUM 10.1 9.5 8.0*  MG 1.8 1.5* 1.7  PHOS  --  3.4  --    GFR: Estimated Creatinine Clearance: 118.1 mL/min (by C-G formula based on SCr of 0.76 mg/dL). Liver Function Tests: Recent Labs  Lab 05/30/21 0616 06/03/21 0710  AST 26 29  ALT 17 32  ALKPHOS 107 79  BILITOT 1.0 1.1  PROT 8.5* 7.3  ALBUMIN 4.7 4.0   Recent Labs  Lab 05/30/21 0616 06/03/21 0710  LIPASE 25 22   No results for input(s): AMMONIA in the last 168 hours. Coagulation Profile: No results for input(s): INR, PROTIME in the last 168 hours. Cardiac Enzymes: No results for input(s): CKTOTAL, CKMB, CKMBINDEX, TROPONINI in the last 168 hours. BNP (last 3 results) No results for input(s): PROBNP in the last 8760 hours. HbA1C: No results for input(s): HGBA1C in the last 72 hours. CBG: No results for input(s): GLUCAP in the last 168 hours. Lipid Profile: No results for input(s): CHOL, HDL, LDLCALC, TRIG, CHOLHDL, LDLDIRECT in the last 72 hours. Thyroid Function Tests: Recent Labs    06/04/21 0625  TSH 7.255*   Anemia Panel: No results for input(s): VITAMINB12, FOLATE, FERRITIN, TIBC, IRON,  RETICCTPCT in the last 72 hours. Sepsis Labs: Recent Labs  Lab 05/30/21 1241 05/30/21 1614 06/03/21 0710 06/03/21 1034 06/03/21 1139  PROCALCITON <0.10  --  <0.10  --   --   LATICACIDVEN 2.2* 1.0  --  1.0 1.2    Recent Results (from the past 240 hour(s))  Resp Panel by RT-PCR (Flu A&B, Covid) Nasopharyngeal Swab     Status: None   Collection Time: 05/30/21  6:44 AM   Specimen: Nasopharyngeal Swab; Nasopharyngeal(NP) swabs in vial transport medium  Result Value Ref Range Status   SARS Coronavirus 2 by RT PCR NEGATIVE NEGATIVE Final    Comment: (NOTE) SARS-CoV-2 target nucleic acids are NOT DETECTED.  The SARS-CoV-2 RNA is generally detectable in upper respiratory specimens during the acute phase of infection. The lowest concentration of SARS-CoV-2 viral copies this assay can detect is 138 copies/mL. A negative result does not preclude SARS-Cov-2 infection and should not be used as the sole basis for treatment or other patient management decisions. A negative result may occur with  improper specimen collection/handling, submission of specimen other than nasopharyngeal swab, presence of viral mutation(s) within the areas targeted by this assay, and inadequate number of viral copies(<138 copies/mL). A negative result must be combined with clinical observations, patient history, and epidemiological information. The expected result is Negative.  Fact Sheet for Patients:  BloggerCourse.com  Fact Sheet for Healthcare Providers:  SeriousBroker.it  This test is no t yet approved or cleared by the [place] FDA and  has been authorized for detection and/or diagnosis of SARS-CoV-2 by FDA under an Emergency Use Authorization (EUA). This EUA will remain  in effect (meaning this test can be used) for the duration of the COVID-19 declaration under Section 564(b)(1) of the Act, 21 U.S.C.section 360bbb-3(b)(1), unless the authorization  is terminated  or revoked sooner.       Influenza A by PCR NEGATIVE NEGATIVE Final   Influenza B by PCR NEGATIVE NEGATIVE Final    Comment: (NOTE) The Xpert Xpress SARS-CoV-2/FLU/RSV plus assay is intended as an aid in the diagnosis of influenza from Nasopharyngeal swab specimens and should not be used as a sole basis for treatment. Nasal washings and aspirates are unacceptable for Xpert Xpress SARS-CoV-2/FLU/RSV testing.  Fact Sheet for Patients: BloggerCourse.com  Fact Sheet for Healthcare Providers: SeriousBroker.it  This test is not yet approved or cleared by the [place] FDA and has been authorized for detection and/or diagnosis of SARS-CoV-2 by FDA under an Emergency Use Authorization (EUA). This EUA will remain in effect (meaning this test can be used) for the duration of the COVID-19 declaration under Section 564(b)(1) of the Act, 21 U.S.C. section 360bbb-3(b)(1), unless the authorization is terminated or revoked.  Performed at [facility],  [address], [place], [place] [address]   Gastrointestinal Panel by PCR , Stool     Status: Abnormal   Collection Time: 05/30/21  9:19 AM   Specimen: STOOL  Result Value Ref Range Status   Campylobacter species NOT DETECTED NOT DETECTED Final   Plesimonas shigelloides NOT DETECTED NOT DETECTED Final   Salmonella species NOT DETECTED NOT DETECTED Final   Yersinia enterocolitica NOT DETECTED NOT DETECTED Final   Vibrio species NOT DETECTED NOT DETECTED Final   Vibrio cholerae NOT DETECTED NOT DETECTED Final   Enteroaggregative E coli (EAEC) NOT DETECTED NOT DETECTED Final   Enteropathogenic E coli (EPEC) DETECTED (A) NOT DETECTED Final    Comment: RESULT CALLED TO, READ BACK BY AND VERIFIED WITH: Leanora Ivanoff, RN AT 1136 ON 05/31/21 BY GM    Enterotoxigenic E coli (ETEC) NOT DETECTED NOT DETECTED Final   Shiga like toxin producing E coli (STEC) NOT DETECTED  NOT DETECTED Final   Shigella/Enteroinvasive E coli (EIEC) NOT DETECTED NOT DETECTED Final   Cryptosporidium NOT DETECTED NOT DETECTED Final   Cyclospora cayetanensis NOT DETECTED NOT DETECTED Final   Entamoeba histolytica NOT DETECTED NOT DETECTED Final   Giardia lamblia NOT DETECTED NOT DETECTED Final   Adenovirus F40/41 NOT DETECTED NOT DETECTED Final   Astrovirus NOT DETECTED NOT DETECTED Final   Norovirus GI/GII NOT DETECTED NOT DETECTED Final   Rotavirus A NOT DETECTED NOT DETECTED Final   Sapovirus (I, Miller, IV, and V) NOT DETECTED NOT DETECTED Final    Comment: Performed at [facility], [address] [place], [place] [address]  Culture, blood (routine x 2)     Status: None   Collection Time: 05/30/21 10:42 AM   Specimen: BLOOD LEFT HAND  Result Value Ref Range Status   Specimen Description BLOOD LEFT HAND  Final   Special Requests   Final    BOTTLES DRAWN AEROBIC AND ANAEROBIC Blood Culture adequate volume   Culture   Final    NO GROWTH 5 DAYS Performed at [facility], [address] [place], [place] [address]    Report Status 06/04/2021 FINAL  Final  Culture, blood (routine x 2)     Status: None   Collection Time: 05/30/21 10:50 AM   Specimen: BLOOD RIGHT HAND  Result Value Ref Range Status   Specimen Description BLOOD RIGHT HAND  Final   Special Requests   Final    BOTTLES DRAWN AEROBIC AND ANAEROBIC Blood Culture adequate volume   Culture   Final    NO GROWTH 5 DAYS Performed at [facility], [address] [place], [place] [address]    Report Status 06/04/2021 FINAL  Final  C Difficile Quick Screen w PCR reflex     Status: None   Collection Time: 05/31/21  9:19 AM   Specimen: STOOL  Result Value Ref Range Status   C Diff antigen NEGATIVE NEGATIVE Final   C Diff toxin NEGATIVE NEGATIVE Final   C Diff interpretation No C. difficile detected.  Final    Comment: Performed at [facility], [address] [place],  [place] [address]  Resp Panel by RT-PCR (Flu A&B, Covid) Nasopharyngeal Swab     Status: None   Collection Time: 06/03/21  7:52 AM   Specimen: Nasopharyngeal Swab; Nasopharyngeal(NP) swabs in vial transport medium  Result Value Ref Range Status   SARS Coronavirus 2 by RT PCR NEGATIVE NEGATIVE Final    Comment: (NOTE) SARS-CoV-2 target nucleic acids are NOT DETECTED.  The SARS-CoV-2 RNA is generally detectable in upper respiratory specimens during the acute phase of infection. The lowest concentration of SARS-CoV-2 viral copies this assay can detect is 138 copies/mL. A negative result does not preclude SARS-Cov-2 infection and should not be used as the sole basis for treatment or other patient management decisions. A negative result may occur with  improper specimen collection/handling, submission of specimen other than nasopharyngeal swab, presence of viral mutation(s) within the areas targeted by this assay, and inadequate number of viral copies(<138 copies/mL). A negative result must be combined with clinical observations, patient history, and epidemiological information. The expected result is Negative.  Fact Sheet for Patients:  BloggerCourse.comhttps://www.fda.gov/media/152166/download  Fact Sheet for Healthcare Providers:  SeriousBroker.ithttps://www.fda.gov/media/152162/download  This test is no t yet approved or cleared by the [place] FDA and  has been authorized for detection and/or diagnosis of SARS-CoV-2 by FDA under an Emergency Use Authorization (EUA). This EUA will remain  in effect (meaning this test can be used) for the duration of the COVID-19 declaration under Section 564(b)(1) of the Act, 21 U.S.C.section 360bbb-3(b)(1), unless the authorization is terminated  or revoked sooner.       Influenza A by PCR NEGATIVE NEGATIVE Final   Influenza B by PCR NEGATIVE NEGATIVE Final    Comment: (NOTE) The Xpert Xpress SARS-CoV-2/FLU/RSV plus assay is intended as an aid in the diagnosis of influenza from  Nasopharyngeal swab specimens and should not be used as a sole basis for treatment. Nasal washings and aspirates are unacceptable for Xpert Xpress SARS-CoV-2/FLU/RSV testing.  Fact Sheet for Patients: BloggerCourse.comhttps://www.fda.gov/media/152166/download  Fact Sheet for Healthcare Providers: SeriousBroker.ithttps://www.fda.gov/media/152162/download  This test is not yet approved or cleared by the [place] FDA and has been authorized for detection and/or diagnosis of SARS-CoV-2 by FDA under an Emergency Use Authorization (EUA). This EUA will remain in effect (meaning this test can be used) for the duration of the COVID-19 declaration under Section 564(b)(1) of the Act, 21 U.S.C. section 360bbb-3(b)(1), unless the authorization is terminated or revoked.  Performed at [facility], [address] [place], [place] [address]   Culture, blood (x 2)     Status: None (Preliminary result)   Collection Time: 06/03/21 10:34 AM   Specimen: BLOOD  Result Value Ref Range Status   Specimen Description BLOOD BLOOD RIGHT HAND  Final   Special Requests   Final    BOTTLES DRAWN AEROBIC AND ANAEROBIC Blood Culture results may not be optimal due to an inadequate volume of blood received in culture bottles   Culture   Final    NO GROWTH < 24 HOURS Performed at [facility], [address] [place], [place] [address]    Report Status PENDING  Incomplete  Culture, blood (x 2)     Status: None (Preliminary result)   Collection Time: 06/03/21 10:36 AM   Specimen: BLOOD  Result Value Ref Range Status   Specimen Description BLOOD BLOOD LEFT HAND  Final   Special Requests   Final    BOTTLES DRAWN AEROBIC AND ANAEROBIC Blood Culture results may not be optimal due to an inadequate volume of blood received in culture bottles   Culture   Final    NO GROWTH < 24 HOURS Performed at [facility], [address] [place], [place] [address]    Report Status PENDING  Incomplete         Radiology  Studies: DG Chest 2 View  Result Date: 06/03/2021 CLINICAL DATA:  55 year old male with history  of weakness, vomiting and diarrhea. Chest pain and shortness of breath. EXAM: CHEST - 2 VIEW COMPARISON:  Chest x-ray 05/30/2021. FINDINGS: Lung volumes are normal. No consolidative airspace disease. No pleural effusions. No pneumothorax. No pulmonary nodule or mass noted. Pulmonary vasculature and the cardiomediastinal silhouette are within normal limits. Status post left shoulder arthroplasty. IMPRESSION: No radiographic evidence of acute cardiopulmonary disease. Electronically Signed   By: Trudie Reed M.D.   On: 06/03/2021 08:00        Scheduled Meds:  acidophilus  2 capsule Oral TID   apixaban  5 mg Oral BID   DULoxetine  60 mg Oral Daily   levothyroxine  75 mcg Oral QAC breakfast   lisinopril  5 mg Oral Daily   metoprolol tartrate  100 mg Oral BID   nicotine  21 mg Transdermal Daily   potassium chloride  40 mEq Oral Once   pregabalin  75 mg Oral TID   rosuvastatin  10 mg Oral Daily   Continuous Infusions:  sodium chloride 75 mL/hr at 06/04/21 0957   diltiazem (CARDIZEM) infusion Stopped (06/03/21 2342)   famotidine (PEPCID) IV Stopped (06/04/21 1111)    Assessment & Plan:   Principal Problem:   Atrial fibrillation with RVR (HCC) Active Problems:   CVA (cerebral vascular accident) (HCC)   Hypertension   Tobacco abuse   Hypothyroidism   Depression   HLD (hyperlipidemia)   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   8/3 now off Cardizem drip and in sinus rhythm  Continue Eliquis  Continue metoprolol  Keep hydrated     Sepsis due to EPEC gastroenteritis: Patient needs criteria for sepsis with WBC 12.7, tachycardia, tachypnea with RR 21.  No fever.  Lactic acid 1.1.   8/30 hemodynamically  stable  No need for antibiotics  Supportive care  Procalcitonin less than 0.10  Continue IV fluids since still with diarrhea     CVA (cerebral vascular accident) (HCC) Continue Crestor and Eliquis   Hypertension Stable continue metoprolol and lisinopril   Tobacco abuse -Nicotine patch    Hypothyroidism -Synthroid   Depression -Cymbalta   Hypomagnesemia: Replaced.  Will give another 2 g IV this a.m.         DVT prophylaxis: Eliquis Code Status:full Family Communication:  Disposition Plan:  Status is: Inpatient  Remains inpatient appropriate because:Inpatient level of care appropriate due to severity of illness  Dispo: The patient is from: Home              Anticipated d/c is to: Home              Patient currently is not medically stable to d/c.   Difficult to place patient No            LOS: 0 days   Time spent: 35 min with  >50% on coc    Lynn Ito, MD [facility] Pager [phone]  If 7PM-7AM, please contact night-coverage 06/04/2021, 3:01 PM

## 2021-06-04 NOTE — Plan of Care (Signed)

## 2021-06-05 LAB — POTASSIUM: Potassium: 3.7 mmol/L (ref 3.5–5.1)

## 2021-06-05 MED ORDER — ACETAMINOPHEN 160 MG/5ML PO SOLN
650.0000 mg | Freq: Four times a day (QID) | ORAL | Status: DC | PRN
  Filled 2021-06-05: qty 20.3

## 2021-06-05 NOTE — Progress Notes (Signed)
PROGRESS NOTE    Marc Miller  PJA:[id number] DOB: 1966-01-11 DOA: 06/03/2021 PCP: [facility]    Brief Narrative:  Marc Miller is a 55 y.o. male with medical history significant of hypertension, stroke, hypothyroidism, depression, atrial fibrillation on Eliquis, tobacco abuse, who presents with nausea, vomiting, diarrhea.   Patient was hospitalized from 7/29-8/1 due to severe sepsis 2/2 EPEC diarrhea and A fib with RVR. Pt was treated with IV fluid and discharged at improved condition.  Patient states that after he went home, initially he was doing fine, but then continued to have nausea, vomiting and diarrhea.  Patient states that he has had 3 times of nonbilious nonbloody vomiting, 18 times of watery diarrhea.  Patient has mild lower abdominal pain.  No symptoms of UTI.  No fever or chills.  Patient states that he had some shortness of breath, and mild chest pain earlier, which has resolved.  Currently no chest pain or shortness breath.  Patient has mild dry cough.   Patient was found to have A. fib with RVR with heart rates up to 165, Cardizem drip started.  8/3 still with watery diarrhea today.   8/4 had 4 episodes of watery diarrhea this AM already.  Nauseous after eating.  Consultants:    Procedures:   Antimicrobials:      Subjective: No abdominal pain  Objective: Vitals:   06/04/21 1620 06/04/21 1953 06/05/21 0444 06/05/21 0724  BP: 115/83 131/77 (!) 131/96 128/88  Pulse: 68 60 63 (!) 55  Resp: 18 18 18 18   Temp: 97.9 F (36.6 C) 98.8 F (37.1 C) 98.3 F (36.8 C) 98.1 F (36.7 C)  TempSrc:   Oral   SpO2: 92% 94% 93% 95%  Weight:      Height:        Intake/Output Summary (Last 24 hours) at 06/05/2021 1434 Last data filed at 06/05/2021 0841 Gross per 24 hour  Intake 985.49 ml  Output 195 ml  Net 790.49 ml   Filed Weights   06/03/21 0706 06/04/21 1416  Weight: 93 kg 90 kg    Examination:  Calm,  NAD CTA no wheeze rales rhonchi's Regular S1-S2 no gallops Soft benign positive bowel sounds No edema Alert oriented x4    Data Reviewed: I have personally reviewed following labs and imaging studies  CBC: Recent Labs  Lab 05/30/21 0616 05/31/21 0506 06/03/21 0710 06/04/21 0625  WBC 13.0* 10.5 12.7* 9.3  NEUTROABS 7.4  --   --   --   HGB 20.4* 16.0 19.6* 15.6  HCT 57.4* 45.8 55.5* 43.8  MCV 90.7 93.7 88.9 90.7  PLT 299 221 302 243   Basic Metabolic Panel: Recent Labs  Lab 05/30/21 0616 06/03/21 0710 06/04/21 0625 06/05/21 0435  NA 141 136 137  --   K 3.5 3.8 3.7 3.7  CL 104 106 105  --   CO2 27 18* 26  --   GLUCOSE 130* 130* 91  --   BUN 19 15 9   --   CREATININE 1.24 0.82 0.76  --   CALCIUM 10.1 9.5 8.0*  --   MG 1.8 1.5* 1.7  --   PHOS  --  3.4  --   --    GFR: Estimated Creatinine Clearance: 116.3 mL/min (by C-G formula based on SCr of 0.76 mg/dL). Liver Function Tests: Recent Labs  Lab 05/30/21 0616 06/03/21 0710  AST 26 29  ALT 17 32  ALKPHOS 107 79  BILITOT  1.0 1.1  PROT 8.5* 7.3  ALBUMIN 4.7 4.0   Recent Labs  Lab 05/30/21 0616 06/03/21 0710  LIPASE 25 22   No results for input(s): AMMONIA in the last 168 hours. Coagulation Profile: No results for input(s): INR, PROTIME in the last 168 hours. Cardiac Enzymes: No results for input(s): CKTOTAL, CKMB, CKMBINDEX, TROPONINI in the last 168 hours. BNP (last 3 results) No results for input(s): PROBNP in the last 8760 hours. HbA1C: No results for input(s): HGBA1C in the last 72 hours. CBG: No results for input(s): GLUCAP in the last 168 hours. Lipid Profile: No results for input(s): CHOL, HDL, LDLCALC, TRIG, CHOLHDL, LDLDIRECT in the last 72 hours. Thyroid Function Tests: Recent Labs    06/04/21 0625  TSH 7.255*   Anemia Panel: No results for input(s): VITAMINB12, FOLATE, FERRITIN, TIBC, IRON, RETICCTPCT in the last 72 hours. Sepsis Labs: Recent Labs  Lab 05/30/21 1241  05/30/21 1614 06/03/21 0710 06/03/21 1034 06/03/21 1139  PROCALCITON <0.10  --  <0.10  --   --   LATICACIDVEN 2.2* 1.0  --  1.0 1.2    Recent Results (from the past 240 hour(s))  Resp Panel by RT-PCR (Flu A&B, Covid) Nasopharyngeal Swab     Status: None   Collection Time: 05/30/21  6:44 AM   Specimen: Nasopharyngeal Swab; Nasopharyngeal(NP) swabs in vial transport medium  Result Value Ref Range Status   SARS Coronavirus 2 by RT PCR NEGATIVE NEGATIVE Final    Comment: (NOTE) SARS-CoV-2 target nucleic acids are NOT DETECTED.  The SARS-CoV-2 RNA is generally detectable in upper respiratory specimens during the acute phase of infection. The lowest concentration of SARS-CoV-2 viral copies this assay can detect is 138 copies/mL. A negative result does not preclude SARS-Cov-2 infection and should not be used as the sole basis for treatment or other patient management decisions. A negative result may occur with  improper specimen collection/handling, submission of specimen other than nasopharyngeal swab, presence of viral mutation(s) within the areas targeted by this assay, and inadequate number of viral copies(<138 copies/mL). A negative result must be combined with clinical observations, patient history, and epidemiological information. The expected result is Negative.  Fact Sheet for Patients:  BloggerCourse.com  Fact Sheet for Healthcare Providers:  SeriousBroker.it  This test is no t yet approved or cleared by the [place] FDA and  has been authorized for detection and/or diagnosis of SARS-CoV-2 by FDA under an Emergency Use Authorization (EUA). This EUA will remain  in effect (meaning this test can be used) for the duration of the COVID-19 declaration under Section 564(b)(1) of the Act, 21 U.S.C.section 360bbb-3(b)(1), unless the authorization is terminated  or revoked sooner.       Influenza A by PCR NEGATIVE NEGATIVE  Final   Influenza B by PCR NEGATIVE NEGATIVE Final    Comment: (NOTE) The Xpert Xpress SARS-CoV-2/FLU/RSV plus assay is intended as an aid in the diagnosis of influenza from Nasopharyngeal swab specimens and should not be used as a sole basis for treatment. Nasal washings and aspirates are unacceptable for Xpert Xpress SARS-CoV-2/FLU/RSV testing.  Fact Sheet for Patients: BloggerCourse.com  Fact Sheet for Healthcare Providers: SeriousBroker.it  This test is not yet approved or cleared by the [place] FDA and has been authorized for detection and/or diagnosis of SARS-CoV-2 by FDA under an Emergency Use Authorization (EUA). This EUA will remain in effect (meaning this test can be used) for the duration of the COVID-19 declaration under Section 564(b)(1) of the Act, 21 U.S.C.  section 360bbb-3(b)(1), unless the authorization is terminated or revoked.  Performed at [facility], [address] [place], [place] [address]   Gastrointestinal Panel by PCR , Stool     Status: Abnormal   Collection Time: 05/30/21  9:19 AM   Specimen: STOOL  Result Value Ref Range Status   Campylobacter species NOT DETECTED NOT DETECTED Final   Plesimonas shigelloides NOT DETECTED NOT DETECTED Final   Salmonella species NOT DETECTED NOT DETECTED Final   Yersinia enterocolitica NOT DETECTED NOT DETECTED Final   Vibrio species NOT DETECTED NOT DETECTED Final   Vibrio cholerae NOT DETECTED NOT DETECTED Final   Enteroaggregative E coli (EAEC) NOT DETECTED NOT DETECTED Final   Enteropathogenic E coli (EPEC) DETECTED (A) NOT DETECTED Final    Comment: RESULT CALLED TO, READ BACK BY AND VERIFIED WITH: Leanora Ivanoff, RN AT 1136 ON 05/31/21 BY GM    Enterotoxigenic E coli (ETEC) NOT DETECTED NOT DETECTED Final   Shiga like toxin producing E coli (STEC) NOT DETECTED NOT DETECTED Final   Shigella/Enteroinvasive E coli (EIEC) NOT DETECTED NOT  DETECTED Final   Cryptosporidium NOT DETECTED NOT DETECTED Final   Cyclospora cayetanensis NOT DETECTED NOT DETECTED Final   Entamoeba histolytica NOT DETECTED NOT DETECTED Final   Giardia lamblia NOT DETECTED NOT DETECTED Final   Adenovirus F40/41 NOT DETECTED NOT DETECTED Final   Astrovirus NOT DETECTED NOT DETECTED Final   Norovirus GI/GII NOT DETECTED NOT DETECTED Final   Rotavirus A NOT DETECTED NOT DETECTED Final   Sapovirus (I, Miller, IV, and V) NOT DETECTED NOT DETECTED Final    Comment: Performed at [facility], [address] [place], [place] [address]  Culture, blood (routine x 2)     Status: None   Collection Time: 05/30/21 10:42 AM   Specimen: BLOOD LEFT HAND  Result Value Ref Range Status   Specimen Description BLOOD LEFT HAND  Final   Special Requests   Final    BOTTLES DRAWN AEROBIC AND ANAEROBIC Blood Culture adequate volume   Culture   Final    NO GROWTH 5 DAYS Performed at [facility], [address] [place], [place] [address]    Report Status 06/04/2021 FINAL  Final  Culture, blood (routine x 2)     Status: None   Collection Time: 05/30/21 10:50 AM   Specimen: BLOOD RIGHT HAND  Result Value Ref Range Status   Specimen Description BLOOD RIGHT HAND  Final   Special Requests   Final    BOTTLES DRAWN AEROBIC AND ANAEROBIC Blood Culture adequate volume   Culture   Final    NO GROWTH 5 DAYS Performed at [facility], [address] [place], [place] [address]    Report Status 06/04/2021 FINAL  Final  C Difficile Quick Screen w PCR reflex     Status: None   Collection Time: 05/31/21  9:19 AM   Specimen: STOOL  Result Value Ref Range Status   C Diff antigen NEGATIVE NEGATIVE Final   C Diff toxin NEGATIVE NEGATIVE Final   C Diff interpretation No C. difficile detected.  Final    Comment: Performed at [facility], [address] [place], [place] [address]  Resp Panel by RT-PCR (Flu A&B, Covid) Nasopharyngeal Swab      Status: None   Collection Time: 06/03/21  7:52 AM   Specimen: Nasopharyngeal Swab; Nasopharyngeal(NP) swabs in vial transport medium  Result Value Ref Range Status   SARS Coronavirus 2 by RT PCR  NEGATIVE NEGATIVE Final    Comment: (NOTE) SARS-CoV-2 target nucleic acids are NOT DETECTED.  The SARS-CoV-2 RNA is generally detectable in upper respiratory specimens during the acute phase of infection. The lowest concentration of SARS-CoV-2 viral copies this assay can detect is 138 copies/mL. A negative result does not preclude SARS-Cov-2 infection and should not be used as the sole basis for treatment or other patient management decisions. A negative result may occur with  improper specimen collection/handling, submission of specimen other than nasopharyngeal swab, presence of viral mutation(s) within the areas targeted by this assay, and inadequate number of viral copies(<138 copies/mL). A negative result must be combined with clinical observations, patient history, and epidemiological information. The expected result is Negative.  Fact Sheet for Patients:  BloggerCourse.comhttps://www.fda.gov/media/152166/download  Fact Sheet for Healthcare Providers:  SeriousBroker.ithttps://www.fda.gov/media/152162/download  This test is no t yet approved or cleared by the [place] FDA and  has been authorized for detection and/or diagnosis of SARS-CoV-2 by FDA under an Emergency Use Authorization (EUA). This EUA will remain  in effect (meaning this test can be used) for the duration of the COVID-19 declaration under Section 564(b)(1) of the Act, 21 U.S.C.section 360bbb-3(b)(1), unless the authorization is terminated  or revoked sooner.       Influenza A by PCR NEGATIVE NEGATIVE Final   Influenza B by PCR NEGATIVE NEGATIVE Final    Comment: (NOTE) The Xpert Xpress SARS-CoV-2/FLU/RSV plus assay is intended as an aid in the diagnosis of influenza from Nasopharyngeal swab specimens and should not be used as a sole basis  for treatment. Nasal washings and aspirates are unacceptable for Xpert Xpress SARS-CoV-2/FLU/RSV testing.  Fact Sheet for Patients: BloggerCourse.comhttps://www.fda.gov/media/152166/download  Fact Sheet for Healthcare Providers: SeriousBroker.ithttps://www.fda.gov/media/152162/download  This test is not yet approved or cleared by the [place] FDA and has been authorized for detection and/or diagnosis of SARS-CoV-2 by FDA under an Emergency Use Authorization (EUA). This EUA will remain in effect (meaning this test can be used) for the duration of the COVID-19 declaration under Section 564(b)(1) of the Act, 21 U.S.C. section 360bbb-3(b)(1), unless the authorization is terminated or revoked.  Performed at [facility], [address] [place], [place] [address]   Culture, blood (x 2)     Status: None (Preliminary result)   Collection Time: 06/03/21 10:34 AM   Specimen: BLOOD  Result Value Ref Range Status   Specimen Description BLOOD BLOOD RIGHT HAND  Final   Special Requests   Final    BOTTLES DRAWN AEROBIC AND ANAEROBIC Blood Culture results may not be optimal due to an inadequate volume of blood received in culture bottles   Culture   Final    NO GROWTH 2 DAYS Performed at [facility], [address] [place], [place] [address]    Report Status PENDING  Incomplete  Culture, blood (x 2)     Status: None (Preliminary result)   Collection Time: 06/03/21 10:36 AM   Specimen: BLOOD  Result Value Ref Range Status   Specimen Description BLOOD BLOOD LEFT HAND  Final   Special Requests   Final    BOTTLES DRAWN AEROBIC AND ANAEROBIC Blood Culture results may not be optimal due to an inadequate volume of blood received in culture bottles   Culture   Final    NO GROWTH 2 DAYS Performed at [facility], [address] [place], [place] [address]    Report Status PENDING  Incomplete         Radiology Studies: No results  found.      Scheduled Meds:  acidophilus  2  capsule Oral TID   apixaban  5 mg Oral BID   DULoxetine  60 mg Oral Daily   levothyroxine  75 mcg Oral QAC breakfast   lisinopril  5 mg Oral Daily   metoprolol tartrate  100 mg Oral BID   nicotine  21 mg Transdermal Daily   potassium chloride  40 mEq Oral Once   pregabalin  75 mg Oral TID   rosuvastatin  10 mg Oral Daily   Continuous Infusions:  sodium chloride 75 mL/hr at 06/05/21 0841   famotidine (PEPCID) IV 20 mg (06/05/21 0948)    Assessment & Plan:   Principal Problem:   Atrial fibrillation with RVR (HCC) Active Problems:   CVA (cerebral vascular accident) (HCC)   Hypertension   Tobacco abuse   Hypothyroidism   Depression   HLD (hyperlipidemia)   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   8/4 continue Eliquis and metoprolol as in sinus rhythm off of Cardizem drip     Sepsis due to EPEC gastroenteritis: Patient needs criteria for sepsis with WBC 12.7, tachycardia, tachypnea with RR 21.  No fever.  Lactic acid 1.1.   hemodynamically stable  No need for antibiotics  Supportive care  Procalcitonin less than 0.10  8/4 continue IV fluids since having diarrhea for hydration       CVA (cerebral vascular accident) (HCC) Continue Eliquis and Crestor   Hypertension Continue metoprolol and lisinopril   Tobacco abuse -Nicotine patch    Hypothyroidism -Synthroid   Depression -Cymbalta   Hypomagnesemia: Replaced.  Will give another 2 g IV this a.m.         DVT prophylaxis: Eliquis Code Status:full Family Communication: Wife at bedside Disposition Plan:  Status is: Inpatient  Remains inpatient appropriate because:Inpatient level of care appropriate due to severity of illness  Dispo: The patient is from: Home              Anticipated d/c is to: Home               Patient currently is not medically stable to d/c.   Difficult to place patient No            LOS: 1 day   Time spent: 35 min with  >50% on coc    Lynn Ito, MD [facility] Pager [phone]  If 7PM-7AM, please contact night-coverage 06/05/2021, 2:34 PM

## 2021-06-06 MED ORDER — PROCHLORPERAZINE EDISYLATE 10 MG/2ML IJ SOLN
10.0000 mg | Freq: Once | INTRAMUSCULAR | Status: AC | PRN
  Administered 2021-06-06: 10 mg via INTRAVENOUS
  Filled 2021-06-06: qty 2

## 2021-06-06 MED ORDER — RISAQUAD PO CAPS: 2.0000 | ORAL_CAPSULE | Freq: Three times a day (TID) | ORAL | 0 refills | Status: AC

## 2021-06-06 NOTE — Discharge Summary (Signed)
[address] [place] Miller EXB:[id number] DOB: 1966/09/25 DOA: 06/03/2021  PCP: [facility]  Admit date: 06/03/2021 Discharge date: 06/06/2021  Admitted From: Home Disposition: Home  Recommendations for Outpatient Follow-up:  Follow up with PCP in 1 week Please obtain BMP/CBC in one week Please follow up with cardiology in 1 week     Discharge Condition:Stable CODE STATUS: Full Diet recommendation: Heart Healthy  Brief/Interim Summary: Per NUU:VOZDGUY Marc Miller is a 55 y.o. male with medical history significant of hypertension, stroke, hypothyroidism, depression, atrial fibrillation on Eliquis, tobacco abuse, who presented with nausea, vomiting, diarrhea.   Patient was hospitalized from 7/29-8/1 due to severe sepsis 2/2 EPEC diarrhea and A fib with RVR. Pt was treated with IV fluid and discharged at improved condition.  Patient states that after he went home, initially he was doing fine, but then continued to have nausea, vomiting and diarrhea.  Patient stated that he has had 3 times of nonbilious nonbloody vomiting, 18 times of watery diarrhea Toxicology was positive for tricyclic, opiates and cannabinoids. Patient was negative for C. difficile on 7/30 with and was positive for E. Coli EPEC. He was started on supportive care with IV fluids.  Electrolytes were supplemented.He has underlying hx/o afib, and was found with afib rvr on admission. Was placed on cardizem drip and converted to sinus rhythm.   Atrial fibrillation with RVR (HCC): Likely triggered by dehydration secondary EPEC gastroenteritis.   Converted from Cardizem drip to metoprolol and in sinus rhythm Continue anticoagulation       Sepsis due to EPEC gastroenteritis:  Was treated with supportive care No need for antibiotics Supportive care Procalcitonin less than 0.10           CVA (cerebral vascular accident) (HCC) Continue Eliquis and Crestor   Hypertension Continue  metoprolol  Discontinue lisinopril due to low bp   Tobacco abuse Recommend quiting    Hypothyroidism -continue Synthroid   Depression -continue Cymbalta/outpatient meds   Hypomagnesemia: Replaced.    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 Avoid recreational drugs/marijuana F/u with cardiology and pcp. Hydrate throughout the day   Increase activity slowly   Complete by: As directed       Allergies as of 06/06/2021       Reactions   Bupropion Other (See Comments)   irritability   Aspirin Other (See Comments)   Other reaction(s): gi upset Other reaction(s): gi upset   Methimazole Rash        Medication List     STOP taking these medications    lisinopril 5 MG tablet Commonly known as: ZESTRIL       TAKE these medications    acidophilus Caps capsule Take 2 capsules by mouth 3 (three) times daily for 14 days.   cyclobenzaprine 10 MG tablet Commonly known as: FLEXERIL Take 10 mg  by mouth 3 (three) times daily as needed for muscle spasms.   DULoxetine 60 MG capsule Commonly known as: CYMBALTA Take 60 mg by mouth daily.   Eliquis 5 MG Tabs tablet Generic drug: apixaban Take 1 tablet (5 mg total) by mouth 2 (two) times daily.   levothyroxine 75 MCG tablet Commonly known as: SYNTHROID Take 75 mcg by mouth daily before breakfast.   metoprolol tartrate 100 MG tablet Commonly  known as: LOPRESSOR Take 100 mg by mouth 2 (two) times daily.   pregabalin 75 MG capsule Commonly known as: LYRICA Take 75 mg by mouth 3 (three) times daily.   rosuvastatin 10 MG tablet Commonly known as: CRESTOR Take 10 mg by mouth daily.        Follow-up Information     [facility] Follow up on 06/12/2021.   Specialty: Family Medicine Why: @ 9am Contact information: [address] [place] [address] [phone]         Lamar Blinks, MD Follow up on 06/20/2021.   Specialty: Cardiology Why: @ 9:30am Contact information: [address] [facility] [place] [address] [phone]                Allergies  Allergen Reactions   Bupropion Other (See Comments)    irritability   Aspirin Other (See Comments)    Other reaction(s): gi upset Other reaction(s): gi upset    Methimazole Rash    Consultations:    Procedures/Studies: DG Chest 2 View  Result Date: 06/03/2021 CLINICAL DATA:  55 year old male with history of weakness, vomiting and diarrhea. Chest pain and shortness of breath. EXAM: CHEST - 2 VIEW COMPARISON:  Chest x-ray 05/30/2021. FINDINGS: Lung volumes are normal. No consolidative airspace disease. No pleural effusions. No pneumothorax. No pulmonary nodule or mass noted. Pulmonary vasculature and the cardiomediastinal silhouette are within normal limits. Status post left shoulder arthroplasty. IMPRESSION: No radiographic evidence of acute cardiopulmonary disease. Electronically Signed   By: Trudie Reed M.D.   On: 06/03/2021 08:00   CT Chest Wo Contrast  Result Date: 05/30/2021 CLINICAL DATA:  55 year old male with right upper quadrant pain, right lung base opacity on CT Abdomen and Pelvis earlier today. EXAM: CT CHEST WITHOUT CONTRAST TECHNIQUE: Multidetector CT imaging of the chest was performed following the standard protocol without IV contrast. COMPARISON:  CT Abdomen  and Pelvis 0914 hours today. Portable chest 0835 hours. FINDINGS: Cardiovascular: No cardiomegaly or pericardial effusion. Calcified coronary artery atherosclerosis on series 2, image 73. Little to no calcified plaque of the thoracic aorta. Mediastinum/Nodes: Mildly enlarged precarinal lymph node measuring 14 mm short axis. Small but increased in number mediastinal nodes elsewhere. No definite hilar lymphadenopathy. Lungs/Pleura: Upper lung paraseptal emphysema. Major airways are patent. Confluent tree in bud solid and sub solid peribronchial opacity in the right lower lobe posterior basal segment on series 3, image 103. Less involvement of the other right lower lobe segments. No consolidation or pleural effusion. Scattered mild subpleural pulmonary scarring elsewhere. No other acute or suspicious pulmonary opacity. Upper Abdomen: Stable, negative visible upper abdomen; IV contrast being excreted from the kidneys. Musculoskeletal: No acute osseous abnormality identified. IMPRESSION: 1. Right lower lobe Bronchopneumonia; segmental peribronchial nodular opacity. No consolidation or pleural effusion. Reactive appearing mediastinal lymph nodes. 2. Underlying  Emphysema (ICD10-J43.9). 3. Calcified coronary artery atherosclerosis. Electronically Signed   By: Odessa Fleming M.D.   On: 05/30/2021 10:40   CT ABDOMEN PELVIS W CONTRAST  Result Date: 05/30/2021  CLINICAL DATA:  Acute right upper quadrant abdominal pain. EXAM: CT ABDOMEN AND PELVIS WITH CONTRAST TECHNIQUE: Multidetector CT imaging of the abdomen and pelvis was performed using the standard protocol following bolus administration of intravenous contrast. CONTRAST:  OMNIPAQUE IOHEXOL 350 MG/ML SOLN COMPARISON:  June 03, 2019. FINDINGS: Lower chest: Mild right posterior basilar infiltrate or atelectasis is noted. Hepatobiliary: No focal liver abnormality is seen. No gallstones, gallbladder wall thickening, or biliary dilatation. Pancreas: Unremarkable. No  pancreatic ductal dilatation or surrounding inflammatory changes. Spleen: Normal in size without focal abnormality. Adrenals/Urinary Tract: Adrenal glands are unremarkable. Kidneys are normal, without renal calculi, focal lesion, or hydronephrosis. Bladder is unremarkable. Stomach/Bowel: Stomach is within normal limits. Appendix appears normal. No evidence of bowel wall thickening, distention, or inflammatory changes. Vascular/Lymphatic: No significant vascular findings are present. No enlarged abdominal or pelvic lymph nodes. Reproductive: Prostate is unremarkable. Other: No abdominal wall hernia or abnormality. No abdominopelvic ascites. Musculoskeletal: No acute or significant osseous findings. IMPRESSION: Mild right posterior basilar subsegmental atelectasis or infiltrate is noted. No other abnormality seen in the abdomen or pelvis. Electronically Signed   By: Lupita Raider M.D.   On: 05/30/2021 09:49   DG Chest Portable 1 View  Result Date: 05/30/2021 CLINICAL DATA:  Low stats per ordering notes. Pt in with sharp RUQ pain that began 1 wk ago. States n/v/d since, hard to breathe. Afib 170-180's in triage. C/o mild R cp EXAM: PORTABLE CHEST - 1 VIEW COMPARISON:  12/24/2013 FINDINGS: Lungs are clear. Heart size and mediastinal contours are within normal limits. No effusion.  No pneumothorax. Left shoulder arthroplasty components partially visualized. IMPRESSION: No acute cardiopulmonary disease. Electronically Signed   By: Corlis Leak M.D.   On: 05/30/2021 08:42   US ABDOMEN LIMITED RUQ (LIVER/GB)  Result Date: 05/30/2021 CLINICAL DATA:  Epigastric pain, nausea, vomiting x1 week EXAM: ULTRASOUND ABDOMEN LIMITED RIGHT UPPER QUADRANT COMPARISON:  CT 06/03/2019 FINDINGS: Gallbladder: No gallstones or wall thickening visualized. No sonographic Murphy sign noted by sonographer. Common bile duct: Diameter: 2.9 mm, unremarkable Liver: No focal lesion identified. Within normal limits in parenchymal echogenicity.  Portal vein is patent on color Doppler imaging with normal direction of blood flow towards the liver. Other: None. IMPRESSION: Negative Electronically Signed   By: Corlis Leak M.D.   On: 05/30/2021 07:32      Subjective: Diarrhea slowing down, more mushy now instead of waterry. He reports feeling better today  Discharge Exam: Vitals:   06/06/21 0744 06/06/21 1118  BP: 100/70 118/75  Pulse: (!) 53 63  Resp: 17 17  Temp: 97.7 F (36.5 C) 97.9 F (36.6 C)  SpO2: 95% 96%   Vitals:   06/06/21 0038 06/06/21 0423 06/06/21 0744 06/06/21 1118  BP: 111/73 (!) 94/51 100/70 118/75  Pulse: (!) 58 (!) 58 (!) 53 63  Resp: Temp: 97.7 F (36.5 C) 97.8 F (36.6 C) 97.7 F (36.5 C) 97.9 F (36.6 C)  TempSrc: Oral Oral    SpO2: 97% 96% 95% 96%  Weight:      Height:        General: Pt is alert, awake, not in acute distress Cardiovascular: RRR, S1/S2 +, no rubs, no gallops Respiratory: CTA bilaterally, no wheezing, no rhonchi Abdominal: Soft, NT, ND, bowel sounds + Extremities: no edema, no cyanosis    The results of significant diagnostics from this hospitalization (including imaging, microbiology, ancillary and laboratory) are listed below for reference.     Microbiology: Recent  Results (from the past 240 hour(s))  Resp Panel by RT-PCR (Flu A&B, Covid) Nasopharyngeal Swab     Status: None   Collection Time: 05/30/21  6:44 AM   Specimen: Nasopharyngeal Swab; Nasopharyngeal(NP) swabs in vial transport medium  Result Value Ref Range Status   SARS Coronavirus 2 by RT PCR NEGATIVE NEGATIVE Final    Comment: (NOTE) SARS-CoV-2 target nucleic acids are NOT DETECTED.  The SARS-CoV-2 RNA is generally detectable in upper respiratory specimens during the acute phase of infection. The lowest concentration of SARS-CoV-2 viral copies this assay can detect is 138 copies/mL. A negative result does not preclude SARS-Cov-2 infection and should not be used as the sole basis for  treatment or other patient management decisions. A negative result may occur with  improper specimen collection/handling, submission of specimen other than nasopharyngeal swab, presence of viral mutation(s) within the areas targeted by this assay, and inadequate number of viral copies(<138 copies/mL). A negative result must be combined with clinical observations, patient history, and epidemiological information. The expected result is Negative.  Fact Sheet for Patients:  BloggerCourse.com  Fact Sheet for Healthcare Providers:  SeriousBroker.it  This test is no t yet approved or cleared by the [place] FDA and  has been authorized for detection and/or diagnosis of SARS-CoV-2 by FDA under an Emergency Use Authorization (EUA). This EUA will remain  in effect (meaning this test can be used) for the duration of the COVID-19 declaration under Section 564(b)(1) of the Act, 21 U.S.C.section 360bbb-3(b)(1), unless the authorization is terminated  or revoked sooner.       Influenza A by PCR NEGATIVE NEGATIVE Final   Influenza B by PCR NEGATIVE NEGATIVE Final    Comment: (NOTE) The Xpert Xpress SARS-CoV-2/FLU/RSV plus assay is intended as an aid in the diagnosis of influenza from Nasopharyngeal swab specimens and should not be used as a sole basis for treatment. Nasal washings and aspirates are unacceptable for Xpert Xpress SARS-CoV-2/FLU/RSV testing.  Fact Sheet for Patients: BloggerCourse.com  Fact Sheet for Healthcare Providers: SeriousBroker.it  This test is not yet approved or cleared by the [place] FDA and has been authorized for detection and/or diagnosis of SARS-CoV-2 by FDA under an Emergency Use Authorization (EUA). This EUA will remain in effect (meaning this test can be used) for the duration of the COVID-19 declaration under Section 564(b)(1) of the Act, 21  U.S.C. section 360bbb-3(b)(1), unless the authorization is terminated or revoked.  Performed at [facility], [address] [place], [place] [address]   Gastrointestinal Panel by PCR , Stool     Status: Abnormal   Collection Time: 05/30/21  9:19 AM   Specimen: STOOL  Result Value Ref Range Status   Campylobacter species NOT DETECTED NOT DETECTED Final   Plesimonas shigelloides NOT DETECTED NOT DETECTED Final   Salmonella species NOT DETECTED NOT DETECTED Final   Yersinia enterocolitica NOT DETECTED NOT DETECTED Final   Vibrio species NOT DETECTED NOT DETECTED Final   Vibrio cholerae NOT DETECTED NOT DETECTED Final   Enteroaggregative E coli (EAEC) NOT DETECTED NOT DETECTED Final   Enteropathogenic E coli (EPEC) DETECTED (A) NOT DETECTED Final    Comment: RESULT CALLED TO, READ BACK BY AND VERIFIED WITH: Leanora Ivanoff, RN AT 1136 ON 05/31/21 BY GM    Enterotoxigenic E coli (ETEC) NOT DETECTED NOT DETECTED Final   Shiga like toxin producing E coli (STEC) NOT DETECTED NOT DETECTED Final   Shigella/Enteroinvasive E coli (EIEC) NOT DETECTED NOT DETECTED Final  Cryptosporidium NOT DETECTED NOT DETECTED Final   Cyclospora cayetanensis NOT DETECTED NOT DETECTED Final   Entamoeba histolytica NOT DETECTED NOT DETECTED Final   Giardia lamblia NOT DETECTED NOT DETECTED Final   Adenovirus F40/41 NOT DETECTED NOT DETECTED Final   Astrovirus NOT DETECTED NOT DETECTED Final   Norovirus GI/GII NOT DETECTED NOT DETECTED Final   Rotavirus A NOT DETECTED NOT DETECTED Final   Sapovirus (I, Miller, IV, and V) NOT DETECTED NOT DETECTED Final    Comment: Performed at [facility], [address] [place], [place] [address]  Culture, blood (routine x 2)     Status: None   Collection Time: 05/30/21 10:42 AM   Specimen: BLOOD LEFT HAND  Result Value Ref Range Status   Specimen Description BLOOD LEFT HAND  Final   Special Requests   Final    BOTTLES DRAWN AEROBIC AND ANAEROBIC Blood  Culture adequate volume   Culture   Final    NO GROWTH 5 DAYS Performed at [facility], [address] [place], [place] [address]    Report Status 06/04/2021 FINAL  Final  Culture, blood (routine x 2)     Status: None   Collection Time: 05/30/21 10:50 AM   Specimen: BLOOD RIGHT HAND  Result Value Ref Range Status   Specimen Description BLOOD RIGHT HAND  Final   Special Requests   Final    BOTTLES DRAWN AEROBIC AND ANAEROBIC Blood Culture adequate volume   Culture   Final    NO GROWTH 5 DAYS Performed at [facility], [address] [place], [place] [address]    Report Status 06/04/2021 FINAL  Final  C Difficile Quick Screen w PCR reflex     Status: None   Collection Time: 05/31/21  9:19 AM   Specimen: STOOL  Result Value Ref Range Status   C Diff antigen NEGATIVE NEGATIVE Final   C Diff toxin NEGATIVE NEGATIVE Final   C Diff interpretation No C. difficile detected.  Final    Comment: Performed at [facility], [address] [place], [place] [address]  Resp Panel by RT-PCR (Flu A&B, Covid) Nasopharyngeal Swab     Status: None   Collection Time: 06/03/21  7:52 AM   Specimen: Nasopharyngeal Swab; Nasopharyngeal(NP) swabs in vial transport medium  Result Value Ref Range Status   SARS Coronavirus 2 by RT PCR NEGATIVE NEGATIVE Final    Comment: (NOTE) SARS-CoV-2 target nucleic acids are NOT DETECTED.  The SARS-CoV-2 RNA is generally detectable in upper respiratory specimens during the acute phase of infection. The lowest concentration of SARS-CoV-2 viral copies this assay can detect is 138 copies/mL. A negative result does not preclude SARS-Cov-2 infection and should not be used as the sole basis for treatment or other patient management decisions. A negative result may occur with  improper specimen collection/handling, submission of specimen other than nasopharyngeal swab, presence of viral mutation(s) within the areas targeted by this assay,  and inadequate number of viral copies(<138 copies/mL). A negative result must be combined with clinical observations, patient history, and epidemiological information. The expected result is Negative.  Fact Sheet for Patients:  BloggerCourse.comhttps://www.fda.gov/media/152166/download  Fact Sheet for Healthcare Providers:  SeriousBroker.ithttps://www.fda.gov/media/152162/download  This test is no t yet approved or cleared by the [place] FDA and  has been authorized for detection and/or diagnosis of SARS-CoV-2 by FDA under an Emergency Use Authorization (EUA). This EUA will remain  in effect (meaning this test can be used) for the duration of the  COVID-19 declaration under Section 564(b)(1) of the Act, 21 U.S.C.section 360bbb-3(b)(1), unless the authorization is terminated  or revoked sooner.       Influenza A by PCR NEGATIVE NEGATIVE Final   Influenza B by PCR NEGATIVE NEGATIVE Final    Comment: (NOTE) The Xpert Xpress SARS-CoV-2/FLU/RSV plus assay is intended as an aid in the diagnosis of influenza from Nasopharyngeal swab specimens and should not be used as a sole basis for treatment. Nasal washings and aspirates are unacceptable for Xpert Xpress SARS-CoV-2/FLU/RSV testing.  Fact Sheet for Patients: BloggerCourse.com  Fact Sheet for Healthcare Providers: SeriousBroker.it  This test is not yet approved or cleared by the [place] FDA and has been authorized for detection and/or diagnosis of SARS-CoV-2 by FDA under an Emergency Use Authorization (EUA). This EUA will remain in effect (meaning this test can be used) for the duration of the COVID-19 declaration under Section 564(b)(1) of the Act, 21 U.S.C. section 360bbb-3(b)(1), unless the authorization is terminated or revoked.  Performed at [facility], [address] [place], [place] [address]   Culture, blood (x 2)     Status: None (Preliminary result)   Collection Time:  06/03/21 10:34 AM   Specimen: BLOOD  Result Value Ref Range Status   Specimen Description BLOOD BLOOD RIGHT HAND  Final   Special Requests   Final    BOTTLES DRAWN AEROBIC AND ANAEROBIC Blood Culture results may not be optimal due to an inadequate volume of blood received in culture bottles   Culture   Final    NO GROWTH 3 DAYS Performed at [facility], [address] [place], [place] [address]    Report Status PENDING  Incomplete  Culture, blood (x 2)     Status: None (Preliminary result)   Collection Time: 06/03/21 10:36 AM   Specimen: BLOOD  Result Value Ref Range Status   Specimen Description BLOOD BLOOD LEFT HAND  Final   Special Requests   Final    BOTTLES DRAWN AEROBIC AND ANAEROBIC Blood Culture results may not be optimal due to an inadequate volume of blood received in culture bottles   Culture   Final    NO GROWTH 3 DAYS Performed at [facility], [address] [place], [place] [address]    Report Status PENDING  Incomplete     Labs: BNP (last 3 results) Recent Labs    05/30/21 0616  BNP 71.9   Basic Metabolic Panel: Recent Labs  Lab 06/03/21 0710 06/04/21 0625 06/05/21 0435  NA 136 137  --   K 3.8 3.7 3.7  CL 106 105  --   CO2 18* 26  --   GLUCOSE 130* 91  --   BUN 15 9  --   CREATININE 0.82 0.76  --   CALCIUM 9.5 8.0*  --   MG 1.5* 1.7  --   PHOS 3.4  --   --    Liver Function Tests: Recent Labs  Lab 06/03/21 0710  AST 29  ALT 32  ALKPHOS 79  BILITOT 1.1  PROT 7.3  ALBUMIN 4.0   Recent Labs  Lab 06/03/21 0710  LIPASE 22   No results for input(s): AMMONIA in the last 168 hours. CBC: Recent Labs  Lab 05/31/21 0506 06/03/21 0710 06/04/21 0625  WBC 10.5 12.7* 9.3  HGB 16.0 19.6* 15.6  HCT 45.8 55.5* 43.8  MCV 93.7 88.9 90.7  PLT 221 302 243   Cardiac Enzymes: No results for input(s): CKTOTAL, CKMB, CKMBINDEX,  TROPONINI in the last 168 hours. BNP: Invalid input(s): POCBNP CBG: No results for input(s):  GLUCAP in the last 168 hours. D-Dimer No results for input(s): DDIMER in the last 72 hours. Hgb A1c No results for input(s): HGBA1C in the last 72 hours. Lipid Profile No results for input(s): CHOL, HDL, LDLCALC, TRIG, CHOLHDL, LDLDIRECT in the last 72 hours. Thyroid function studies Recent Labs    06/04/21 0625  TSH 7.255*   Anemia work up No results for input(s): VITAMINB12, FOLATE, FERRITIN, TIBC, IRON, RETICCTPCT in the last 72 hours. Urinalysis    Component Value Date/Time   COLORURINE YELLOW (A) 06/03/2019 1405   APPEARANCEUR CLEAR (A) 06/03/2019 1405   LABSPEC >1.046 (H) 06/03/2019 1405   PHURINE 6.0 06/03/2019 1405   GLUCOSEU NEGATIVE 06/03/2019 1405   HGBUR MODERATE (A) 06/03/2019 1405   BILIRUBINUR NEGATIVE 06/03/2019 1405   KETONESUR NEGATIVE 06/03/2019 1405   PROTEINUR NEGATIVE 06/03/2019 1405   NITRITE NEGATIVE 06/03/2019 1405   LEUKOCYTESUR NEGATIVE 06/03/2019 1405   Sepsis Labs Invalid input(s): PROCALCITONIN,  WBC,  LACTICIDVEN Microbiology Recent Results (from the past 240 hour(s))  Resp Panel by RT-PCR (Flu A&B, Covid) Nasopharyngeal Swab     Status: None   Collection Time: 05/30/21  6:44 AM   Specimen: Nasopharyngeal Swab; Nasopharyngeal(NP) swabs in vial transport medium  Result Value Ref Range Status   SARS Coronavirus 2 by RT PCR NEGATIVE NEGATIVE Final    Comment: (NOTE) SARS-CoV-2 target nucleic acids are NOT DETECTED.  The SARS-CoV-2 RNA is generally detectable in upper respiratory specimens during the acute phase of infection. The lowest concentration of SARS-CoV-2 viral copies this assay can detect is 138 copies/mL. A negative result does not preclude SARS-Cov-2 infection and should not be used as the sole basis for treatment or other patient management decisions. A negative result may occur with  improper specimen collection/handling, submission of specimen other than nasopharyngeal swab, presence of viral mutation(s) within the areas  targeted by this assay, and inadequate number of viral copies(<138 copies/mL). A negative result must be combined with clinical observations, patient history, and epidemiological information. The expected result is Negative.  Fact Sheet for Patients:  BloggerCourse.com  Fact Sheet for Healthcare Providers:  SeriousBroker.it  This test is no t yet approved or cleared by the [place] FDA and  has been authorized for detection and/or diagnosis of SARS-CoV-2 by FDA under an Emergency Use Authorization (EUA). This EUA will remain  in effect (meaning this test can be used) for the duration of the COVID-19 declaration under Section 564(b)(1) of the Act, 21 U.S.C.section 360bbb-3(b)(1), unless the authorization is terminated  or revoked sooner.       Influenza A by PCR NEGATIVE NEGATIVE Final   Influenza B by PCR NEGATIVE NEGATIVE Final    Comment: (NOTE) The Xpert Xpress SARS-CoV-2/FLU/RSV plus assay is intended as an aid in the diagnosis of influenza from Nasopharyngeal swab specimens and should not be used as a sole basis for treatment. Nasal washings and aspirates are unacceptable for Xpert Xpress SARS-CoV-2/FLU/RSV testing.  Fact Sheet for Patients: BloggerCourse.com  Fact Sheet for Healthcare Providers: SeriousBroker.it  This test is not yet approved or cleared by the [place] FDA and has been authorized for detection and/or diagnosis of SARS-CoV-2 by FDA under an Emergency Use Authorization (EUA). This EUA will remain in effect (meaning this test can be used) for the duration of the COVID-19 declaration under Section 564(b)(1) of the Act, 21 U.S.C. section 360bbb-3(b)(1), unless the authorization is terminated  or revoked.  Performed at [facility], [address] [place], [place] [address]   Gastrointestinal Panel by PCR , Stool     Status: Abnormal    Collection Time: 05/30/21  9:19 AM   Specimen: STOOL  Result Value Ref Range Status   Campylobacter species NOT DETECTED NOT DETECTED Final   Plesimonas shigelloides NOT DETECTED NOT DETECTED Final   Salmonella species NOT DETECTED NOT DETECTED Final   Yersinia enterocolitica NOT DETECTED NOT DETECTED Final   Vibrio species NOT DETECTED NOT DETECTED Final   Vibrio cholerae NOT DETECTED NOT DETECTED Final   Enteroaggregative E coli (EAEC) NOT DETECTED NOT DETECTED Final   Enteropathogenic E coli (EPEC) DETECTED (A) NOT DETECTED Final    Comment: RESULT CALLED TO, READ BACK BY AND VERIFIED WITH: Leanora Ivanoff, RN AT 1136 ON 05/31/21 BY GM    Enterotoxigenic E coli (ETEC) NOT DETECTED NOT DETECTED Final   Shiga like toxin producing E coli (STEC) NOT DETECTED NOT DETECTED Final   Shigella/Enteroinvasive E coli (EIEC) NOT DETECTED NOT DETECTED Final   Cryptosporidium NOT DETECTED NOT DETECTED Final   Cyclospora cayetanensis NOT DETECTED NOT DETECTED Final   Entamoeba histolytica NOT DETECTED NOT DETECTED Final   Giardia lamblia NOT DETECTED NOT DETECTED Final   Adenovirus F40/41 NOT DETECTED NOT DETECTED Final   Astrovirus NOT DETECTED NOT DETECTED Final   Norovirus GI/GII NOT DETECTED NOT DETECTED Final   Rotavirus A NOT DETECTED NOT DETECTED Final   Sapovirus (I, Miller, IV, and V) NOT DETECTED NOT DETECTED Final    Comment: Performed at [facility], [address] [place], [place] [address]  Culture, blood (routine x 2)     Status: None   Collection Time: 05/30/21 10:42 AM   Specimen: BLOOD LEFT HAND  Result Value Ref Range Status   Specimen Description BLOOD LEFT HAND  Final   Special Requests   Final    BOTTLES DRAWN AEROBIC AND ANAEROBIC Blood Culture adequate volume   Culture   Final    NO GROWTH 5 DAYS Performed at [facility], [address] [place], [place] [address]    Report Status 06/04/2021 FINAL  Final  Culture, blood (routine x 2)     Status: None    Collection Time: 05/30/21 10:50 AM   Specimen: BLOOD RIGHT HAND  Result Value Ref Range Status   Specimen Description BLOOD RIGHT HAND  Final   Special Requests   Final    BOTTLES DRAWN AEROBIC AND ANAEROBIC Blood Culture adequate volume   Culture   Final    NO GROWTH 5 DAYS Performed at [facility], [address] [place], [place] [address]    Report Status 06/04/2021 FINAL  Final  C Difficile Quick Screen w PCR reflex     Status: None   Collection Time: 05/31/21  9:19 AM   Specimen: STOOL  Result Value Ref Range Status   C Diff antigen NEGATIVE NEGATIVE Final   C Diff toxin NEGATIVE NEGATIVE Final   C Diff interpretation No C. difficile detected.  Final    Comment: Performed at [facility], [address] [place], [place] [address]  Resp Panel by RT-PCR (Flu A&B, Covid) Nasopharyngeal Swab     Status: None   Collection Time: 06/03/21  7:52 AM   Specimen: Nasopharyngeal Swab; Nasopharyngeal(NP) swabs in vial transport medium  Result Value Ref Range Status   SARS Coronavirus 2 by RT PCR NEGATIVE NEGATIVE Final    Comment: (  NOTE) SARS-CoV-2 target nucleic acids are NOT DETECTED.  The SARS-CoV-2 RNA is generally detectable in upper respiratory specimens during the acute phase of infection. The lowest concentration of SARS-CoV-2 viral copies this assay can detect is 138 copies/mL. A negative result does not preclude SARS-Cov-2 infection and should not be used as the sole basis for treatment or other patient management decisions. A negative result may occur with  improper specimen collection/handling, submission of specimen other than nasopharyngeal swab, presence of viral mutation(s) within the areas targeted by this assay, and inadequate number of viral copies(<138 copies/mL). A negative result must be combined with clinical observations, patient history, and epidemiological information. The expected result is Negative.  Fact Sheet for Patients:   BloggerCourse.com  Fact Sheet for Healthcare Providers:  SeriousBroker.it  This test is no t yet approved or cleared by the [place] FDA and  has been authorized for detection and/or diagnosis of SARS-CoV-2 by FDA under an Emergency Use Authorization (EUA). This EUA will remain  in effect (meaning this test can be used) for the duration of the COVID-19 declaration under Section 564(b)(1) of the Act, 21 U.S.C.section 360bbb-3(b)(1), unless the authorization is terminated  or revoked sooner.       Influenza A by PCR NEGATIVE NEGATIVE Final   Influenza B by PCR NEGATIVE NEGATIVE Final    Comment: (NOTE) The Xpert Xpress SARS-CoV-2/FLU/RSV plus assay is intended as an aid in the diagnosis of influenza from Nasopharyngeal swab specimens and should not be used as a sole basis for treatment. Nasal washings and aspirates are unacceptable for Xpert Xpress SARS-CoV-2/FLU/RSV testing.  Fact Sheet for Patients: BloggerCourse.com  Fact Sheet for Healthcare Providers: SeriousBroker.it  This test is not yet approved or cleared by the [place] FDA and has been authorized for detection and/or diagnosis of SARS-CoV-2 by FDA under an Emergency Use Authorization (EUA). This EUA will remain in effect (meaning this test can be used) for the duration of the COVID-19 declaration under Section 564(b)(1) of the Act, 21 U.S.C. section 360bbb-3(b)(1), unless the authorization is terminated or revoked.  Performed at [facility], [address] [place], [place] [address]   Culture, blood (x 2)     Status: None (Preliminary result)   Collection Time: 06/03/21 10:34 AM   Specimen: BLOOD  Result Value Ref Range Status   Specimen Description BLOOD BLOOD RIGHT HAND  Final   Special Requests   Final    BOTTLES DRAWN AEROBIC AND ANAEROBIC Blood Culture results may not be optimal due to  an inadequate volume of blood received in culture bottles   Culture   Final    NO GROWTH 3 DAYS Performed at [facility], [address] [place], [place] [address]    Report Status PENDING  Incomplete  Culture, blood (x 2)     Status: None (Preliminary result)   Collection Time: 06/03/21 10:36 AM   Specimen: BLOOD  Result Value Ref Range Status   Specimen Description BLOOD BLOOD LEFT HAND  Final   Special Requests   Final    BOTTLES DRAWN AEROBIC AND ANAEROBIC Blood Culture results may not be optimal due to an inadequate volume of blood received in culture bottles   Culture   Final    NO GROWTH 3 DAYS Performed at [facility], [address], [place], [place] [address]    Report Status PENDING  Incomplete     Time coordinating discharge: Over 30 minutes  SIGNED:   Lynn Ito, MD  [facility] 06/06/2021, 1:48 PM Pager   If 7PM-7AM, please contact night-coverage [email or web address] Password [email or web address]

## 2021-06-08 LAB — CULTURE, BLOOD (ROUTINE X 2)
Culture: NO GROWTH
Culture: NO GROWTH

## 2021-12-05 ENCOUNTER — Telehealth: Payer: Self-pay | Admitting: Pharmacist

## 2021-12-05 NOTE — Telephone Encounter (Signed)
Patient failed to provide requested 2023 financial documentation. No additional medication assistance will be provided by [facility] without the required proof of income documentation. Patient notified by letter. ? ?Deborah Barnes ?[facility] ?

## 2021-12-09 ENCOUNTER — Telehealth: Payer: Self-pay | Admitting: Pharmacy Technician

## 2021-12-09 NOTE — Telephone Encounter (Signed)
°  [address] °[place], [place]  [address] ° °December 05, 2021 ° ° ° °Dear Marc Miller: ° °This is to inform you that you are no longer eligible to receive medication assistance at [facility].  The reason(s) are:   ° °_____Your total gross monthly household income exceeds 300% of the Federal Poverty Level.   °_____Tangible assets (savings, checking, stocks/bonds, pension, retirement, etc.) exceeds our limit  °_____You are eligible to receive benefits from Medicaid, [facility] or HIV Medication    °          Assistance Program °_____You are eligible to receive benefits from a Medicare Part “D” plan °_____You have prescription insurance  °_____You are not an [place] resident °__X__Failure to provide all requested documentation (proof of income for 2023, and/or Patient Intake Application, DOH Attestation, Contract, etc).   ° °Medication assistance will resume once all requested documentation has been returned to our clinic.  If you have questions, please contact our clinic at [phone].   ° °Thank you, ° °[facility]  °

## 2021-12-16 ENCOUNTER — Other Ambulatory Visit: Payer: Self-pay
